# Patient Record
Sex: Female | Born: 1940 | Race: White | Hispanic: No | Smoking: Never smoker
Health system: Southern US, Community
[De-identification: ages and names within clinical notes are randomized; demographics above are authoritative.]

## PROBLEM LIST (undated history)

## (undated) DIAGNOSIS — F329 Major depressive disorder, single episode, unspecified: Secondary | ICD-10-CM

## (undated) DIAGNOSIS — F411 Generalized anxiety disorder: Secondary | ICD-10-CM

## (undated) DIAGNOSIS — M1990 Unspecified osteoarthritis, unspecified site: Secondary | ICD-10-CM

## (undated) DIAGNOSIS — J020 Streptococcal pharyngitis: Secondary | ICD-10-CM

## (undated) DIAGNOSIS — R0982 Postnasal drip: Secondary | ICD-10-CM

## (undated) DIAGNOSIS — F419 Anxiety disorder, unspecified: Secondary | ICD-10-CM

## (undated) DIAGNOSIS — F325 Major depressive disorder, single episode, in full remission: Secondary | ICD-10-CM

## (undated) DIAGNOSIS — M722 Plantar fascial fibromatosis: Secondary | ICD-10-CM

## (undated) DIAGNOSIS — R41840 Attention and concentration deficit: Secondary | ICD-10-CM

## (undated) HISTORY — DX: Attention and concentration deficit: R41.840

## (undated) HISTORY — DX: Plantar fascial fibromatosis: M72.2

## (undated) HISTORY — DX: Major depressive disorder, single episode, unspecified: F32.9

## (undated) HISTORY — PX: OTHER SURGICAL HISTORY: SHX169

## (undated) HISTORY — PX: BREAST CYST ASPIRATION: SHX578

## (undated) HISTORY — DX: Generalized anxiety disorder: F41.1

## (undated) HISTORY — DX: Major depressive disorder, single episode, in full remission: F32.5

## (undated) HISTORY — DX: Unspecified osteoarthritis, unspecified site: M19.90

---

## 1986-05-24 HISTORY — PX: ABDOMINAL HYSTERECTOMY: SHX81

## 1998-11-20 ENCOUNTER — Ambulatory Visit (HOSPITAL_COMMUNITY): Admission: RE | Admit: 1998-11-20 | Discharge: 1998-11-20 | Payer: Self-pay | Admitting: Obstetrics & Gynecology

## 1999-05-26 ENCOUNTER — Encounter: Payer: Self-pay | Admitting: *Deleted

## 1999-05-26 ENCOUNTER — Encounter: Admission: RE | Admit: 1999-05-26 | Discharge: 1999-05-26 | Payer: Self-pay | Admitting: *Deleted

## 2000-04-08 ENCOUNTER — Encounter: Admission: RE | Admit: 2000-04-08 | Discharge: 2000-04-08 | Payer: Self-pay | Admitting: Occupational Medicine

## 2000-04-08 ENCOUNTER — Encounter: Payer: Self-pay | Admitting: Occupational Medicine

## 2000-07-14 ENCOUNTER — Encounter: Admission: RE | Admit: 2000-07-14 | Discharge: 2000-07-14 | Payer: Self-pay | Admitting: *Deleted

## 2000-07-14 ENCOUNTER — Encounter: Payer: Self-pay | Admitting: *Deleted

## 2000-07-21 ENCOUNTER — Encounter: Admission: RE | Admit: 2000-07-21 | Discharge: 2000-07-21 | Payer: Self-pay | Admitting: *Deleted

## 2000-07-21 ENCOUNTER — Encounter: Payer: Self-pay | Admitting: *Deleted

## 2001-08-17 ENCOUNTER — Encounter: Payer: Self-pay | Admitting: *Deleted

## 2001-08-17 ENCOUNTER — Encounter: Admission: RE | Admit: 2001-08-17 | Discharge: 2001-08-17 | Payer: Self-pay | Admitting: *Deleted

## 2002-11-05 ENCOUNTER — Encounter: Payer: Self-pay | Admitting: *Deleted

## 2002-11-05 ENCOUNTER — Encounter: Admission: RE | Admit: 2002-11-05 | Discharge: 2002-11-05 | Payer: Self-pay | Admitting: *Deleted

## 2003-02-11 ENCOUNTER — Encounter (INDEPENDENT_AMBULATORY_CARE_PROVIDER_SITE_OTHER): Payer: Self-pay | Admitting: Specialist

## 2003-02-11 ENCOUNTER — Ambulatory Visit (HOSPITAL_COMMUNITY): Admission: RE | Admit: 2003-02-11 | Discharge: 2003-02-11 | Payer: Self-pay | Admitting: Gastroenterology

## 2003-11-08 ENCOUNTER — Encounter: Admission: RE | Admit: 2003-11-08 | Discharge: 2003-11-08 | Payer: Self-pay | Admitting: Family Medicine

## 2003-11-21 ENCOUNTER — Encounter: Admission: RE | Admit: 2003-11-21 | Discharge: 2003-11-21 | Payer: Self-pay | Admitting: Family Medicine

## 2004-11-12 ENCOUNTER — Encounter: Admission: RE | Admit: 2004-11-12 | Discharge: 2004-11-12 | Payer: Self-pay | Admitting: Family Medicine

## 2005-12-17 ENCOUNTER — Encounter: Admission: RE | Admit: 2005-12-17 | Discharge: 2005-12-17 | Payer: Self-pay | Admitting: Family Medicine

## 2006-12-19 ENCOUNTER — Encounter: Admission: RE | Admit: 2006-12-19 | Discharge: 2006-12-19 | Payer: Self-pay | Admitting: Family Medicine

## 2007-07-08 ENCOUNTER — Encounter: Admission: RE | Admit: 2007-07-08 | Discharge: 2007-07-08 | Payer: Self-pay | Admitting: Family Medicine

## 2007-12-29 ENCOUNTER — Encounter: Admission: RE | Admit: 2007-12-29 | Discharge: 2007-12-29 | Payer: Self-pay | Admitting: Family Medicine

## 2009-04-03 ENCOUNTER — Encounter: Admission: RE | Admit: 2009-04-03 | Discharge: 2009-04-03 | Payer: Self-pay | Admitting: Family Medicine

## 2010-04-28 ENCOUNTER — Encounter: Admission: RE | Admit: 2010-04-28 | Discharge: 2010-04-28 | Payer: Self-pay | Admitting: Family Medicine

## 2010-06-15 ENCOUNTER — Encounter: Payer: Self-pay | Admitting: Family Medicine

## 2010-10-09 NOTE — Op Note (Signed)
   [name], [name]                         ACCOUNT NO.:  [id number]   MEDICAL RECORD NO.:  [id number]                   PATIENT TYPE:  AMB   LOCATION:  ENDO                                 FACILITY:  [facility]   PHYSICIAN:  Petra Kuba, M.D.                 DATE OF BIRTH:  Jun 02, 1940   DATE OF PROCEDURE:  02/11/2003  DATE OF DISCHARGE:                                 OPERATIVE REPORT   PROCEDURE:  Colonoscopy with polypectomy.   INDICATIONS:  Occasional melena, due for colonic screening.  Consent was  signed after risks, benefits, methods, and options thoroughly discussed in  the office.   MEDICINES USED:  Demerol 60 mg, Versed 6 mg.   DESCRIPTION OF PROCEDURE:  Rectal inspection was pertinent for external  hemorrhoids, small.  Digital exam was negative.  The video pediatric  adjustable colonoscope was inserted and despite a long, looping colon, with  abdominal pressure was able to be advanced to the cecum.  No obvious  abnormality was seen on insertion or signs of bleeding.  The scope was  inserted a short way into the terminal ileum, which was normal.  The scope  was slowly withdrawn.  Prep was adequate.  There was some liquid stool that  required washing and suctioning.  On slow withdrawal through the colon, in  the mid-transverse a questionable tiny polyp was seen and was hot biopsied  x1.  The scope was withdrawn around the left side of the colon.  In the  distal sigmoid and rectum, three other tiny polyps were seen and were all  hot biopsied and put in a second container.  Anorectal pull-through in  retroflexion confirmed some hemorrhoids.  The scope was straightened and  readvanced a short way up the left side of the colon, air was suctioned, and  the scope removed.  The patient tolerated the procedure well.  There was no  obvious immediate complication.   ENDOSCOPIC DIAGNOSES:  1. Internal-external hemorrhoids.  2. Four tiny polyps hot biopsied, one in the  transverse, three in the rectum     and distal sigmoid.  3. Long, looping colon.  4. Otherwise within normal limits to the end of the terminal ileum without     any blood being seen.   PLAN:  1. Await pathology to determine future colonic screening.  2. Continue workup with a one-time EGD.                                               Petra Kuba, M.D.   MEM/MEDQ  D:  02/11/2003  T:  02/11/2003  Job:  [id number]   cc:   Al Decant. Janey Greaser, MD  [address]  [place]  [place] [address]  Fax: [phone]

## 2010-10-09 NOTE — Op Note (Signed)
   NAMEKEEVA, Anne [name]                         ACCOUNT NO.:  [id number]   MEDICAL RECORD NO.:  [id number]                   PATIENT TYPE:  AMB   LOCATION:  ENDO                                 FACILITY:  [facility]   PHYSICIAN:  Petra Kuba, M.D.                 DATE OF BIRTH:  May 01, 1941   DATE OF PROCEDURE:  02/11/2003  DATE OF DISCHARGE:                                 OPERATIVE REPORT   PROCEDURE:  Esophagogastroduodenoscopy with biopsy.   INDICATIONS:  Patient with occasional melena, nondiagnostic colonoscopy.  Consent was signed after risks, benefits, methods, and options thoroughly  discussed before any premedications given.   Additional medicines for this procedure:  Demerol 20 mg, Versed 2 mg.   DESCRIPTION OF PROCEDURE:  The video endoscope was inserted by direct  vision.  The esophagus was normal.  In the distal esophagus was a small  hiatal hernia.  The scope passed into the stomach, advanced through the  antrum, pertinent for some minimal antritis, through a normal pylorus, and  into a normal duodenal bulb, and around the C-loop to a normal second  portion of the duodenum.  The scope was withdrawn back to the bulb and a  good look there ruled out ulcer in that location.  The stomach was evaluated  on retroflex and straight visualization.  There was a moderate amount of  proximal, slightly hemorrhagic gastritis greater than distal gastritis.  High in the cardia the hiatal hernia was confirmed.  Otherwise, the  angularis, fundus, lesser and greater curve were normal on retroflex and  straight visualization.  Air was suctioned.  The scope was slowly withdrawn  after one biopsy of the antrum for the CLOtest to rule out Helicobacter was  obtained.  Again a good look at the esophagus was normal.  The scope was  removed.  The patient tolerated the procedure well.  There was no obvious  abnormality or complication.   ENDOSCOPIC DIAGNOSES:  1. Small hiatal hernia.  2.  Moderate proximal greater than distal gastritis, status post CLO biopsy     to rule out Helicobacter.  3. Otherwise normal EGD.   PLAN:  1. Once a day over-the-counter Prilosec.  2. Follow up in two months to recheck symptoms, possibly CBC, and see if a     one-time small bowel follow-through is needed.                                               Petra Kuba, M.D.    MEM/MEDQ  D:  02/11/2003  T:  02/11/2003  Job:  [id number]   cc:   Al Decant. Janey Greaser, MD  [address]  [place]  [place] [address]  Fax: [phone]

## 2011-04-20 ENCOUNTER — Other Ambulatory Visit: Payer: Self-pay | Admitting: Family Medicine

## 2011-04-20 DIAGNOSIS — Z1231 Encounter for screening mammogram for malignant neoplasm of breast: Secondary | ICD-10-CM

## 2011-04-30 ENCOUNTER — Ambulatory Visit
Admission: RE | Admit: 2011-04-30 | Discharge: 2011-04-30 | Disposition: A | Discharge status: Home / Self Care | Payer: BC Managed Care – PPO | Source: Ambulatory Visit | Attending: Family Medicine | Admitting: Family Medicine

## 2011-04-30 DIAGNOSIS — Z1231 Encounter for screening mammogram for malignant neoplasm of breast: Secondary | ICD-10-CM

## 2011-05-25 HISTORY — PX: EYE SURGERY: SHX253

## 2012-05-08 ENCOUNTER — Other Ambulatory Visit: Payer: Self-pay | Admitting: Family Medicine

## 2012-05-08 DIAGNOSIS — Z1231 Encounter for screening mammogram for malignant neoplasm of breast: Secondary | ICD-10-CM

## 2012-05-12 ENCOUNTER — Ambulatory Visit: Payer: BC Managed Care – PPO

## 2012-05-12 ENCOUNTER — Ambulatory Visit
Admission: RE | Admit: 2012-05-12 | Discharge: 2012-05-12 | Disposition: A | Discharge status: Home / Self Care | Payer: Medicare Other | Source: Ambulatory Visit | Attending: Family Medicine | Admitting: Family Medicine

## 2012-05-12 DIAGNOSIS — Z1231 Encounter for screening mammogram for malignant neoplasm of breast: Secondary | ICD-10-CM

## 2012-05-18 ENCOUNTER — Ambulatory Visit: Payer: Medicare Other

## 2012-05-31 ENCOUNTER — Ambulatory Visit: Payer: BC Managed Care – PPO

## 2013-06-24 NOTE — H&P (Signed)
Subjective:  Chief Complaint: left hip OA / pain  HPI: Anne [name], 73 y.o. female, has a history of pain and functional disability in the left hip(s) due to arthritis and patient has failed non-surgical conservative treatments for greater than 12 weeks to include NSAID's and/or analgesics, corticosteriod injections and activity modification. Onset of symptoms was gradual starting 3+ years ago with gradually worsening course since that time.The patient noted no past surgery on the left hip(s). Patient currently rates pain in the left hip at 8 out of 10 with activity. Patient has worsening of pain with activity and weight bearing, trendelenberg gait, pain that interfers with activities of daily living and pain with passive range of motion. Patient has evidence of periarticular osteophytes and joint space narrowing by imaging studies. This condition presents safety issues increasing the risk of falls. There is no current active infection. Risks, benefits and expectations were discussed with the patient. Risks including but not limited to the risk of anesthesia, blood clots, nerve damage, blood vessel damage, failure of the prosthesis, infection and up to and including death. Patient understand the risks, benefits and expectations and wishes to proceed with surgery.  D/C Plans: Home with HHPT/SNF  Post-op Meds: No Rx given  Tranexamic Acid: To be given  Decadron: To be given  FYI: ASA post-op Norco post-op  The patient is a [age] year old female who comes in today for a preoperative History and Physical. The patient is scheduled for a left total knee arthroplasty to be performed by Dr. Madlyn FrankelMatthew D. Charlann Boxerlin, MD at [facility] on 07/03/13 . Please see the hospital record for complete dictated history and physical.     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  06/05/2008    Allergies(Daniel R Southard; 06/15/2013 11:46 AM) SULFA. 06/06/2008    Family History(Devun Anna S Jodeci Rini, PA-C; 06/24/2013 3:13 PM) Heart disease in female family member before age 955 Heart Disease. First Degree Relatives. father Cancer. mother mother and brother Congestive Heart Failure. father    Social History(Daniel R Southard; 06/15/2013 11:46 AM) Number of flights of stairs before winded. 4-5 Tobacco / smoke exposure. no Alcohol use. current drinker; drinks wine; 5-7 per week current drinker; drinks wine and hard liquor; 8-14 per week Children. 3 Current work status. retired Financial plannerDrug/Alcohol Rehab (Currently). no Copy of Drug/Alcohol Rehab (Previously). no Exercise. Exercises daily; does running / walking Exercises daily; does running / walking and other Illicit drug use. no Living situation. live alone Marital status. widowed Pain Contract. no Tobacco use. Never smoker. never smoker    Medication History(Daniel R Southard; 06/15/2013 11:46 AM) Tylenol Arthritis Pain (650MG  Tablet ER, Oral) Active. [place] ( Oral) Active. Oxybutynin Chloride ER (10MG  Tablet ER 24HR, Oral) Active. Hyoscyamine Sulfate (0.125MG  Tab Sublingual, Sublingual) Active. Zoloft Active.    Past Surgical History(Shana Zavaleta S Jairen Goldfarb, PA-C; 06/24/2013 3:13 PM) Hysterectomy. complete (cancerous) Tonsillectomy Foot Surgery. right Cataract Surgery. bilateral Colon Polyp Removal - Colonoscopy    Other Problems(Amilia Vandenbrink S Keenan Trefry, PA-C; 06/24/2013 3:13 PM) Oophorectomy. bilateral No pertinent past medical history Depression    Review of Systems(Kenyada Dosch S Heavenleigh Petruzzi, PA-C; 06/24/2013 3:13 PM) General:Not Present- Chills, Fever, Night Sweats, Appetite Loss, Fatigue, Feeling sick, Weight Gain and Weight Loss. Skin:Not Present- Itching, Rash, Skin Color Changes, Ulcer, Psoriasis and Change in Hair or Nails. HEENT:Present- Sensitivity to light and Hearing  problems. Not Present- Nose Bleed and Ringing in the Ears. Neck:Not Present- Swollen Glands and Neck Mass. Respiratory:Not  Present- Snoring, Chronic Cough, Bloody sputum and Dyspnea. Cardiovascular:Not Present- Shortness of Breath, Chest Pain, Swelling of Extremities, Leg Cramps and Palpitations. Gastrointestinal:Not Present- Bloody Stool, Heartburn, Abdominal Pain, Vomiting, Nausea and Incontinence of Stool. Female Genitourinary:Present- Urinary frequency. Not Present- Blood in Urine, Menstrual Irregularities, Frequency, Incontinence and Nocturia. Musculoskeletal:Present- Muscle Pain and Joint Pain. Not Present- Muscle Weakness, Joint Stiffness, Joint Swelling and Back Pain. Neurological:Not Present- Tingling, Numbness, Burning, Tremor, Headaches and Dizziness. Psychiatric:Not Present- Anxiety, Depression and Memory Loss. Endocrine:Not Present- Cold Intolerance, Heat Intolerance, Excessive hunger and Excessive Thirst. Hematology:Not Present- Abnormal Bleeding, Anemia, Blood Clots and Easy Bruising.    Vitals(Daniel R Southard; 06/15/2013 11:45 AM) 06/15/2013 11:45 AM Weight: 167 lb Height: 66.5 in Weight was reported by patient. Height was reported by patient. Body Surface Area: 1.89 m Body Mass Index: 26.55 kg/m BP: 132/80 (Sitting, Left Arm, Standard)     Physical Exam(Hiran Leard S Tinslee Klare, PA-C; 06/24/2013 3:17 PM) The physical exam findings are as follows:   General Mental Status - Alert, cooperative and good historian. General Appearance- pleasant. Not in acute distress. Orientation- Oriented X3. Build & Nutrition- Well nourished and Well developed.   Head and Neck Head- normocephalic, atraumatic . Neck Global Assessment- supple. no bruit auscultated on the right and no bruit auscultated on the left.   Eye Pupil- Bilateral- PERRLA. Motion- Bilateral- EOMI.   Chest and Lung Exam Auscultation: Breath sounds:- clear at anterior chest  wall and - clear at posterior chest wall. Adventitious sounds:- No Adventitious sounds.   Cardiovascular Auscultation:Rhythm- Regular rate and rhythm. Heart Sounds- S1 WNL and S2 WNL. Murmurs & Other Heart Sounds:Auscultation of the heart reveals - No Murmurs.   Abdomen Palpation/Percussion:Tenderness- Abdomen is non-tender to palpation. Rigidity (guarding)- Abdomen is soft.   Female Genitourinary   Note: Not done, not pertinent to present illness  Musculoskeletal Lower Extremity Left Lower Extremity: Left Hip : Inspection and Palpation:Clinical Contours- normal clinical contours. Tenderness- generalized tenderness about the hip, medial thigh tender to palpation, lateral thigh tender to palpation and tender to palpation about the groin. Swelling- none. Sensation- intact to light touch. Other characteristics- normal warmth. Skin:Color- no ecchymosis and no erythema. Assessment of pain reveals the following findings:: The pain is characterized as - severe, constant and deep. ROM: Flexion:AROM- Moderately decreased. Abduction:AROM- Moderately decreased. Internal Rotation:AROM- Moderately decreased. External Rotation:AROM- Moderately decreased.

## 2013-06-25 ENCOUNTER — Encounter (HOSPITAL_COMMUNITY): Payer: Self-pay | Admitting: Pharmacy Technician

## 2013-06-26 ENCOUNTER — Other Ambulatory Visit (HOSPITAL_COMMUNITY): Payer: Self-pay | Admitting: Orthopedic Surgery

## 2013-06-26 DIAGNOSIS — J020 Streptococcal pharyngitis: Secondary | ICD-10-CM

## 2013-06-26 HISTORY — DX: Streptococcal pharyngitis: J02.0

## 2013-06-26 NOTE — Patient Instructions (Signed)
20 Anne MorinGeorgina C [name]  06/26/2013   Your procedure is scheduled on: Tuesday February 10th  Report to Anne [name] at 515 AM.  Call this number if you have problems the morning of surgery [phone]   Remember: NO VISITORS UNDER AGE 71 PER [place] POLICY.   Do not eat food or drink liquids :After Midnight.     Take these medicines the morning of surgery with A SIP OF WATER:                                 SEE [place] PREPARING FOR SURGERY SHEET             You may not have any metal on your body including hair pins and piercings  Do not wear jewelry, make-up.  Do not wear lotions, powders, or perfumes. No  Deodorant is to be worn.   Men may shave face and neck.  Do not bring valuables to the hospital. [facility] IS NOT RESPONSIBLE FOR VALUEABLES.  Contacts, dentures or bridgework may not be worn into surgery.  Leave suitcase in the car. After surgery it may be brought to your room.  For patients admitted to the hospital, checkout time is 11:00 AM the day of discharge.    Please read over the following fact sheets that you were given: [facility] Preparing for surgery sheet, MRSA information, incentive spirometer sheet, blood fact sheet  Call Anne SieveSharon Mckenzy Salazar RN pre op nurse if needed [phone]    FAILURE TO FOLLOW THESE INSTRUCTIONS MAY RESULT IN THE CANCELLATION OF YOUR SURGERY.  PATIENT SIGNATURE___________________________________________  NURSE SIGNATURE_____________________________________________

## 2013-06-27 ENCOUNTER — Encounter (INDEPENDENT_AMBULATORY_CARE_PROVIDER_SITE_OTHER): Payer: Self-pay

## 2013-06-27 NOTE — Progress Notes (Signed)
Patient with strep  Throat on antibiotics less than 24 hours called dr Charlann Boxerolin, cancel pre op 06-27-13, do pre op 06-29-13 and pt ok for surgery 07-03-13, does not need medical clearance per dr Charlann Boxerolin, pre op rescheduled for 06-29-13 800 am

## 2013-06-29 ENCOUNTER — Encounter (HOSPITAL_COMMUNITY)
Admission: RE | Admit: 2013-06-29 | Discharge: 2013-06-29 | Disposition: A | Discharge status: Home / Self Care | Payer: Medicare Other | Source: Ambulatory Visit | Attending: Orthopedic Surgery | Admitting: Orthopedic Surgery

## 2013-06-29 ENCOUNTER — Encounter (HOSPITAL_COMMUNITY): Payer: Self-pay

## 2013-06-29 DIAGNOSIS — Z01812 Encounter for preprocedural laboratory examination: Secondary | ICD-10-CM | POA: Insufficient documentation

## 2013-06-29 HISTORY — DX: Anxiety disorder, unspecified: F41.9

## 2013-06-29 HISTORY — DX: Streptococcal pharyngitis: J02.0

## 2013-06-29 HISTORY — DX: Major depressive disorder, single episode, unspecified: F32.9

## 2013-06-29 HISTORY — DX: Unspecified osteoarthritis, unspecified site: M19.90

## 2013-06-29 LAB — BASIC METABOLIC PANEL
BUN: 12 mg/dL (ref 6–23)
CO2: 30 meq/L (ref 19–32)
Calcium: 9.1 mg/dL (ref 8.4–10.5)
Chloride: 100 mEq/L (ref 96–112)
Creatinine, Ser: 0.76 mg/dL (ref 0.50–1.10)
GFR calc Af Amer: >90 mL/min (ref >90)
GFR calc non Af Amer: 83 mL/min — ABNORMAL LOW (ref >90)
GLUCOSE: 71 mg/dL (ref 70–99)
POTASSIUM: 4.5 meq/L (ref 3.7–5.3)
SODIUM: 140 meq/L (ref 137–147)

## 2013-06-29 LAB — CBC
HCT: 40.9 % (ref 36.0–46.0)
Hemoglobin: 13.3 g/dL (ref 12.0–15.0)
MCH: 30.2 pg (ref 26.0–34.0)
MCHC: 32.5 g/dL (ref 30.0–36.0)
MCV: 93 fL (ref 78.0–100.0)
Platelets: 170 10*3/uL (ref 150–400)
RBC: 4.4 MIL/uL (ref 3.87–5.11)
RDW: 12.7 % (ref 11.5–15.5)
WBC: 5 10*3/uL (ref 4.0–10.5)

## 2013-06-29 LAB — PROTIME-INR
INR: 0.9 (ref 0.00–1.49)
PROTHROMBIN TIME: 12 s (ref 11.6–15.2)

## 2013-06-29 LAB — ABO/RH: ABO/RH(D): A POS

## 2013-06-29 LAB — URINALYSIS, ROUTINE W REFLEX MICROSCOPIC
BILIRUBIN URINE: NEGATIVE
Glucose, UA: NEGATIVE mg/dL
Hgb urine dipstick: NEGATIVE
Ketones, ur: NEGATIVE mg/dL
NITRITE: NEGATIVE
Protein, ur: NEGATIVE mg/dL
SPECIFIC GRAVITY, URINE: 1.022 (ref 1.005–1.030)
UROBILINOGEN UA: 0.2 mg/dL (ref 0.0–1.0)
pH: 6 (ref 5.0–8.0)

## 2013-06-29 LAB — URINE MICROSCOPIC-ADD ON

## 2013-06-29 LAB — SURGICAL PCR SCREEN
MRSA, PCR: NEGATIVE
STAPHYLOCOCCUS AUREUS: POSITIVE — AB

## 2013-06-29 LAB — APTT: aPTT: 26 seconds (ref 24–37)

## 2013-06-29 NOTE — Progress Notes (Signed)
Micro ua results sent by epic to dr Charlann Boxerolin

## 2013-06-29 NOTE — Progress Notes (Signed)
Spoke with patient by phone, patient stated feeling much better and stronger now.

## 2013-06-29 NOTE — Patient Instructions (Addendum)
20 Alvester MorinGeorgina C Cipollone  06/29/2013   Your procedure is scheduled on:  Tuesday February 10th  Report to [facility] at 515 AM.  Call this number if you have problems the morning of surgery [phone]   Remember: call Rogerio Boutelle with you antibiotic dosages phone number [phone]  Do not eat food or drink liquids :After Midnight.     Take these medicines the morning of surgery with A SIP OF WATER: none                                SEE [place] PREPARING FOR SURGERY SHEET             You may not have any metal on your body including hair pins and piercings  Do not wear jewelry, make-up.  Do not wear lotions, powders, or perfumes. No  Deodorant is to be worn.   Men may shave face and neck.  Do not bring valuables to the hospital.  IS NOT RESPONSIBLE FOR VALUEABLES.  Contacts, dentures or bridgework may not be worn into surgery.  Leave suitcase in the car. After surgery it may be brought to your room.  For patients admitted to the hospital, checkout time is 11:00 AM the day of discharge.    Please read over the following fact sheets that you were given: [facility] Preparing for surgery sheet, MRSA information, incentive spirometer sheet, blood fact sheet  Call Cain SieveSharon Lorenna Lurry RN pre op nurse if needed [phone]    FAILURE TO FOLLOW THESE INSTRUCTIONS MAY RESULT IN THE CANCELLATION OF YOUR SURGERY.  PATIENT SIGNATURE___________________________________________  NURSE SIGNATURE_____________________________________________

## 2013-06-29 NOTE — Progress Notes (Signed)
CALLED SHERRY WILLS AND MADE AWARE PATIENT NOT FEELING WELL AT PRE OP, SHERRY WILL PASS THIS INFORMATION TO DR Charlann BoxerLIN.

## 2013-07-02 ENCOUNTER — Encounter (HOSPITAL_COMMUNITY): Payer: Self-pay | Admitting: Anesthesiology

## 2013-07-02 NOTE — Anesthesia Preprocedure Evaluation (Addendum)
Anesthesia Evaluation  Patient identified by MRN, date of birth, ID band Patient awake    Reviewed: Allergy & Precautions, H&P , NPO status , Patient's Chart, lab work & pertinent test results  Airway Mallampati: II TM Distance: >3 FB Neck ROM: Full    Dental no notable dental hx.    Pulmonary neg pulmonary ROS,  breath sounds clear to auscultation  Pulmonary exam normal       Cardiovascular negative cardio ROS  Rhythm:Regular Rate:Normal     Neuro/Psych PSYCHIATRIC DISORDERS Anxiety Depression negative neurological ROS     GI/Hepatic negative GI ROS, Neg liver ROS,   Endo/Other  negative endocrine ROS  Renal/GU negative Renal ROS  negative genitourinary   Musculoskeletal negative musculoskeletal ROS (+)   Abdominal   Peds negative pediatric ROS (+)  Hematology negative hematology ROS (+)   Anesthesia Other Findings   Reproductive/Obstetrics negative OB ROS                           Anesthesia Physical Anesthesia Plan  ASA: II  Anesthesia Plan: Spinal   Post-op Pain Management:    Induction: Intravenous  Airway Management Planned:   Additional Equipment:   Intra-op Plan:   Post-operative Plan:   Informed Consent: I have reviewed the patients History and Physical, chart, labs and discussed the procedure including the risks, benefits and alternatives for the proposed anesthesia with the patient or authorized representative who has indicated his/her understanding and acceptance.   Dental advisory given  Plan Discussed with: CRNA  Anesthesia Plan Comments: (Discussed r/b general versus spinal. Discussed risks/benefits of spinal including headache, backache, failure, bleeding, infection, and nerve damage. Patient consents to spinal. Questions answered. Coagulation studies and platelet count acceptable.  She has been on amoxicillin for 7 days for strep throat and she has  recovered. Dr. Charlann Boxerlin aware.)       Anesthesia Quick Evaluation

## 2013-07-03 ENCOUNTER — Inpatient Hospital Stay (HOSPITAL_COMMUNITY): Payer: Medicare Other | Admitting: Anesthesiology

## 2013-07-03 ENCOUNTER — Encounter (HOSPITAL_COMMUNITY)
Admission: RE | Disposition: A | Discharge status: Home Health | Payer: Self-pay | Source: Ambulatory Visit | Attending: Orthopedic Surgery

## 2013-07-03 ENCOUNTER — Encounter (HOSPITAL_COMMUNITY): Payer: Self-pay

## 2013-07-03 ENCOUNTER — Inpatient Hospital Stay (HOSPITAL_COMMUNITY)
Admission: RE | Admit: 2013-07-03 | Discharge: 2013-07-05 | DRG: 470 | Disposition: A | Discharge status: Home Health | Payer: Medicare Other | Source: Ambulatory Visit | Attending: Orthopedic Surgery | Admitting: Orthopedic Surgery

## 2013-07-03 ENCOUNTER — Inpatient Hospital Stay (HOSPITAL_COMMUNITY): Payer: Medicare Other

## 2013-07-03 ENCOUNTER — Encounter (HOSPITAL_COMMUNITY): Payer: Medicare Other | Admitting: Anesthesiology

## 2013-07-03 DIAGNOSIS — Z96649 Presence of unspecified artificial hip joint: Secondary | ICD-10-CM

## 2013-07-03 DIAGNOSIS — D62 Acute posthemorrhagic anemia: Secondary | ICD-10-CM | POA: Diagnosis not present

## 2013-07-03 DIAGNOSIS — F3289 Other specified depressive episodes: Secondary | ICD-10-CM | POA: Diagnosis present

## 2013-07-03 DIAGNOSIS — F411 Generalized anxiety disorder: Secondary | ICD-10-CM | POA: Diagnosis present

## 2013-07-03 DIAGNOSIS — Z79899 Other long term (current) drug therapy: Secondary | ICD-10-CM

## 2013-07-03 DIAGNOSIS — D500 Iron deficiency anemia secondary to blood loss (chronic): Secondary | ICD-10-CM | POA: Diagnosis not present

## 2013-07-03 DIAGNOSIS — Z6827 Body mass index (BMI) 27.0-27.9, adult: Secondary | ICD-10-CM

## 2013-07-03 DIAGNOSIS — Z01812 Encounter for preprocedural laboratory examination: Secondary | ICD-10-CM

## 2013-07-03 DIAGNOSIS — M1610 Unilateral primary osteoarthritis, unspecified hip: Principal | ICD-10-CM | POA: Diagnosis present

## 2013-07-03 DIAGNOSIS — E663 Overweight: Secondary | ICD-10-CM | POA: Diagnosis present

## 2013-07-03 DIAGNOSIS — F329 Major depressive disorder, single episode, unspecified: Secondary | ICD-10-CM | POA: Diagnosis present

## 2013-07-03 DIAGNOSIS — M169 Osteoarthritis of hip, unspecified: Principal | ICD-10-CM | POA: Diagnosis present

## 2013-07-03 HISTORY — DX: Presence of unspecified artificial hip joint: Z96.649

## 2013-07-03 HISTORY — PX: TOTAL HIP ARTHROPLASTY: SHX124

## 2013-07-03 HISTORY — DX: Postnasal drip: R09.82

## 2013-07-03 LAB — TYPE AND SCREEN
ABO/RH(D): A POS
ANTIBODY SCREEN: NEGATIVE

## 2013-07-03 SURGERY — ARTHROPLASTY, HIP, TOTAL, ANTERIOR APPROACH
Anesthesia: Spinal | Site: Hip | Laterality: Left

## 2013-07-03 MED ORDER — KETAMINE HCL 10 MG/ML IJ SOLN
INTRAMUSCULAR | Status: AC
  Filled 2013-07-03: qty 1

## 2013-07-03 MED ORDER — ALUM & MAG HYDROXIDE-SIMETH 200-200-20 MG/5ML PO SUSP: 30.0000 mL | ORAL | Status: DC | PRN

## 2013-07-03 MED ORDER — KETAMINE HCL 10 MG/ML IJ SOLN
INTRAMUSCULAR | Status: DC | PRN
  Administered 2013-07-03 (×4): 5 mg via INTRAVENOUS

## 2013-07-03 MED ORDER — LACTATED RINGERS IV SOLN
INTRAVENOUS | Status: DC
Start: 2013-07-03 — End: 2013-07-03
  Administered 2013-07-03: 1000 mL via INTRAVENOUS

## 2013-07-03 MED ORDER — MIDAZOLAM HCL 2 MG/2ML IJ SOLN
INTRAMUSCULAR | Status: AC
  Filled 2013-07-03: qty 2

## 2013-07-03 MED ORDER — BUPIVACAINE IN DEXTROSE 0.75-8.25 % IT SOLN
INTRATHECAL | Status: DC | PRN
  Administered 2013-07-03: 2 mL via INTRATHECAL

## 2013-07-03 MED ORDER — SERTRALINE HCL 50 MG PO TABS
150.0000 mg | ORAL_TABLET | Freq: Every morning | ORAL | Status: DC
  Administered 2013-07-03 – 2013-07-05 (×3): 150 mg via ORAL
  Filled 2013-07-03 (×3): qty 1

## 2013-07-03 MED ORDER — CEFAZOLIN SODIUM-DEXTROSE 2-3 GM-% IV SOLR
INTRAVENOUS | Status: AC
  Filled 2013-07-03: qty 50

## 2013-07-03 MED ORDER — MIDAZOLAM HCL 5 MG/5ML IJ SOLN
INTRAMUSCULAR | Status: DC | PRN
  Administered 2013-07-03: 2 mg via INTRAVENOUS

## 2013-07-03 MED ORDER — HYDROMORPHONE HCL PF 1 MG/ML IJ SOLN
0.2000 mg | INTRAMUSCULAR | Status: DC | PRN
  Administered 2013-07-03 – 2013-07-04 (×2): 0.5 mg via INTRAVENOUS
  Filled 2013-07-03 (×2): qty 1

## 2013-07-03 MED ORDER — PROPOFOL 10 MG/ML IV BOLUS
INTRAVENOUS | Status: AC
Start: 2013-07-03 — End: 2013-07-03
  Filled 2013-07-03: qty 20

## 2013-07-03 MED ORDER — ASPIRIN EC 325 MG PO TBEC
325.0000 mg | DELAYED_RELEASE_TABLET | Freq: Two times a day (BID) | ORAL | Status: DC
  Administered 2013-07-04 – 2013-07-05 (×3): 325 mg via ORAL
  Filled 2013-07-03 (×5): qty 1

## 2013-07-03 MED ORDER — DEXAMETHASONE SODIUM PHOSPHATE 10 MG/ML IJ SOLN
10.0000 mg | Freq: Once | INTRAMUSCULAR | Status: DC
  Filled 2013-07-03: qty 1

## 2013-07-03 MED ORDER — PROPOFOL INFUSION 10 MG/ML OPTIME
INTRAVENOUS | Status: DC | PRN
  Administered 2013-07-03: 75 ug/kg/min via INTRAVENOUS

## 2013-07-03 MED ORDER — CEFAZOLIN SODIUM-DEXTROSE 2-3 GM-% IV SOLR
2.0000 g | INTRAVENOUS | Status: AC
  Administered 2013-07-03: 2 g via INTRAVENOUS

## 2013-07-03 MED ORDER — ZOLPIDEM TARTRATE 5 MG PO TABS: 5.0000 mg | ORAL_TABLET | Freq: Every evening | ORAL | Status: DC | PRN

## 2013-07-03 MED ORDER — DEXAMETHASONE SODIUM PHOSPHATE 10 MG/ML IJ SOLN
10.0000 mg | Freq: Once | INTRAMUSCULAR | Status: AC
Start: 2013-07-03 — End: 2013-07-03
  Administered 2013-07-03: 10 mg via INTRAVENOUS

## 2013-07-03 MED ORDER — PROPOFOL 10 MG/ML IV BOLUS
INTRAVENOUS | Status: AC
  Filled 2013-07-03: qty 20

## 2013-07-03 MED ORDER — FENTANYL CITRATE 0.05 MG/ML IJ SOLN
INTRAMUSCULAR | Status: AC
  Filled 2013-07-03: qty 5

## 2013-07-03 MED ORDER — PHENYLEPHRINE HCL 10 MG/ML IJ SOLN
INTRAMUSCULAR | Status: DC | PRN
  Administered 2013-07-03: 80 ug via INTRAVENOUS
  Administered 2013-07-03 (×3): 40 ug via INTRAVENOUS

## 2013-07-03 MED ORDER — CHLORHEXIDINE GLUCONATE 4 % EX LIQD: 60.0000 mL | Freq: Once | CUTANEOUS | Status: DC

## 2013-07-03 MED ORDER — ONDANSETRON HCL 4 MG/2ML IJ SOLN
INTRAMUSCULAR | Status: DC | PRN
  Administered 2013-07-03: 4 mg via INTRAVENOUS

## 2013-07-03 MED ORDER — HYDROCODONE-ACETAMINOPHEN 7.5-325 MG PO TABS
1.0000 | ORAL_TABLET | ORAL | Status: DC
  Administered 2013-07-03: 2 via ORAL
  Administered 2013-07-03: 1 via ORAL
  Administered 2013-07-03: 2 via ORAL
  Administered 2013-07-03: 1 via ORAL
  Administered 2013-07-04 (×3): 2 via ORAL
  Administered 2013-07-04: 1 via ORAL
  Administered 2013-07-05 (×2): 2 via ORAL
  Filled 2013-07-03: qty 2
  Filled 2013-07-03: qty 1
  Filled 2013-07-03: qty 2
  Filled 2013-07-03: qty 1
  Filled 2013-07-03: qty 2
  Filled 2013-07-03: qty 1
  Filled 2013-07-03 (×5): qty 2

## 2013-07-03 MED ORDER — EPHEDRINE SULFATE 50 MG/ML IJ SOLN
INTRAMUSCULAR | Status: AC
  Filled 2013-07-03: qty 1

## 2013-07-03 MED ORDER — LIDOCAINE HCL (CARDIAC) 20 MG/ML IV SOLN
INTRAVENOUS | Status: AC
  Filled 2013-07-03: qty 5

## 2013-07-03 MED ORDER — HYDROMORPHONE HCL PF 1 MG/ML IJ SOLN: 0.2500 mg | INTRAMUSCULAR | Status: DC | PRN

## 2013-07-03 MED ORDER — TRANEXAMIC ACID 100 MG/ML IV SOLN
1000.0000 mg | Freq: Once | INTRAVENOUS | Status: AC
  Administered 2013-07-03: 1000 mg via INTRAVENOUS
  Filled 2013-07-03: qty 10

## 2013-07-03 MED ORDER — CEFAZOLIN SODIUM 1-5 GM-% IV SOLN
1.0000 g | Freq: Four times a day (QID) | INTRAVENOUS | Status: AC
  Administered 2013-07-03 (×2): 1 g via INTRAVENOUS
  Filled 2013-07-03 (×2): qty 50

## 2013-07-03 MED ORDER — OXYBUTYNIN CHLORIDE ER 10 MG PO TB24
10.0000 mg | ORAL_TABLET | Freq: Every day | ORAL | Status: DC
  Administered 2013-07-03 – 2013-07-04 (×2): 10 mg via ORAL
  Filled 2013-07-03 (×3): qty 1

## 2013-07-03 MED ORDER — SODIUM CHLORIDE 0.9 % IJ SOLN
INTRAMUSCULAR | Status: AC
  Filled 2013-07-03: qty 10

## 2013-07-03 MED ORDER — DOCUSATE SODIUM 100 MG PO CAPS
100.0000 mg | ORAL_CAPSULE | Freq: Two times a day (BID) | ORAL | Status: DC
  Administered 2013-07-03 – 2013-07-05 (×4): 100 mg via ORAL

## 2013-07-03 MED ORDER — PROMETHAZINE HCL 25 MG/ML IJ SOLN: 6.2500 mg | INTRAMUSCULAR | Status: DC | PRN

## 2013-07-03 MED ORDER — DIPHENHYDRAMINE HCL 12.5 MG/5ML PO ELIX: 25.0000 mg | ORAL_SOLUTION | Freq: Four times a day (QID) | ORAL | Status: DC | PRN

## 2013-07-03 MED ORDER — MENTHOL 3 MG MT LOZG
1.0000 | LOZENGE | OROMUCOSAL | Status: DC | PRN
  Filled 2013-07-03: qty 9

## 2013-07-03 MED ORDER — ONDANSETRON HCL 4 MG PO TABS: 4.0000 mg | ORAL_TABLET | Freq: Four times a day (QID) | ORAL | Status: DC | PRN

## 2013-07-03 MED ORDER — POLYETHYLENE GLYCOL 3350 17 G PO PACK: 17.0000 g | PACK | Freq: Every day | ORAL | Status: DC | PRN

## 2013-07-03 MED ORDER — HYOSCYAMINE SULFATE 0.125 MG SL SUBL
0.1250 mg | SUBLINGUAL_TABLET | SUBLINGUAL | Status: DC | PRN
  Filled 2013-07-03: qty 1

## 2013-07-03 MED ORDER — DEXTROSE 5 % IV SOLN
500.0000 mg | Freq: Four times a day (QID) | INTRAVENOUS | Status: DC | PRN
  Administered 2013-07-03: 500 mg via INTRAVENOUS
  Filled 2013-07-03: qty 5

## 2013-07-03 MED ORDER — METHOCARBAMOL 500 MG PO TABS
500.0000 mg | ORAL_TABLET | Freq: Four times a day (QID) | ORAL | Status: DC | PRN
  Administered 2013-07-03 – 2013-07-04 (×3): 500 mg via ORAL
  Filled 2013-07-03 (×4): qty 1

## 2013-07-03 MED ORDER — ROCURONIUM BROMIDE 100 MG/10ML IV SOLN
INTRAVENOUS | Status: AC
  Filled 2013-07-03: qty 1

## 2013-07-03 MED ORDER — FERROUS SULFATE 325 (65 FE) MG PO TABS
325.0000 mg | ORAL_TABLET | Freq: Three times a day (TID) | ORAL | Status: DC
  Administered 2013-07-03 – 2013-07-05 (×4): 325 mg via ORAL
  Filled 2013-07-03 (×8): qty 1

## 2013-07-03 MED ORDER — SODIUM CHLORIDE 0.9 % IV SOLN
INTRAVENOUS | Status: DC
  Administered 2013-07-03 (×2): via INTRAVENOUS
  Filled 2013-07-03 (×8): qty 1000

## 2013-07-03 MED ORDER — EPHEDRINE SULFATE 50 MG/ML IJ SOLN
INTRAMUSCULAR | Status: DC | PRN
  Administered 2013-07-03: 10 mg via INTRAVENOUS
  Administered 2013-07-03: 5 mg via INTRAVENOUS
  Administered 2013-07-03: 10 mg via INTRAVENOUS

## 2013-07-03 MED ORDER — LACTATED RINGERS IV SOLN
INTRAVENOUS | Status: DC | PRN
  Administered 2013-07-03 (×2): via INTRAVENOUS

## 2013-07-03 MED ORDER — ONDANSETRON HCL 4 MG/2ML IJ SOLN
INTRAMUSCULAR | Status: AC
  Filled 2013-07-03: qty 2

## 2013-07-03 MED ORDER — PROPOFOL 10 MG/ML IV EMUL
INTRAVENOUS | Status: DC | PRN
  Administered 2013-07-03: 20 mg via INTRAVENOUS

## 2013-07-03 MED ORDER — ONDANSETRON HCL 4 MG/2ML IJ SOLN: 4.0000 mg | Freq: Four times a day (QID) | INTRAMUSCULAR | Status: DC | PRN

## 2013-07-03 MED ORDER — PHENOL 1.4 % MT LIQD
1.0000 | OROMUCOSAL | Status: DC | PRN
  Filled 2013-07-03: qty 177

## 2013-07-03 MED ORDER — DEXAMETHASONE SODIUM PHOSPHATE 10 MG/ML IJ SOLN
INTRAMUSCULAR | Status: AC
  Filled 2013-07-03: qty 1

## 2013-07-03 MED ORDER — 0.9 % SODIUM CHLORIDE (POUR BTL) OPTIME
TOPICAL | Status: DC | PRN
  Administered 2013-07-03: 1000 mL

## 2013-07-03 MED ORDER — CHLORHEXIDINE GLUCONATE 4 % EX LIQD
60.0000 mL | Freq: Once | CUTANEOUS | Status: DC
Start: 2013-07-04 — End: 2013-07-03

## 2013-07-03 MED ORDER — SENNA 8.6 MG PO TABS
1.0000 | ORAL_TABLET | Freq: Two times a day (BID) | ORAL | Status: DC
  Administered 2013-07-03 – 2013-07-05 (×4): 8.6 mg via ORAL

## 2013-07-03 SURGICAL SUPPLY — 41 items
ADH SKN CLS APL DERMABOND .7 (GAUZE/BANDAGES/DRESSINGS) ×1
BAG SPEC THK2 15X12 ZIP CLS (MISCELLANEOUS)
BAG ZIPLOCK 12X15 (MISCELLANEOUS) IMPLANT
BLADE SAW SGTL 18X1.27X75 (BLADE) ×2 IMPLANT
CAPT HIP PF COP ×1 IMPLANT
DERMABOND ADVANCED (GAUZE/BANDAGES/DRESSINGS) ×1
DERMABOND ADVANCED .7 DNX12 (GAUZE/BANDAGES/DRESSINGS) ×1 IMPLANT
DRAPE C-ARM 42X120 X-RAY (DRAPES) ×2 IMPLANT
DRAPE STERI IOBAN 125X83 (DRAPES) ×2 IMPLANT
DRAPE U-SHAPE 47X51 STRL (DRAPES) ×6 IMPLANT
DRSG AQUACEL AG ADV 3.5X10 (GAUZE/BANDAGES/DRESSINGS) ×2 IMPLANT
DRSG TEGADERM 4X4.75 (GAUZE/BANDAGES/DRESSINGS) IMPLANT
DURAPREP 26ML APPLICATOR (WOUND CARE) ×2 IMPLANT
ELECT BLADE TIP CTD 4 INCH (ELECTRODE) ×2 IMPLANT
ELECT REM PT RETURN 9FT ADLT (ELECTROSURGICAL) ×2
ELECTRODE REM PT RTRN 9FT ADLT (ELECTROSURGICAL) ×1 IMPLANT
EVACUATOR 1/8 PVC DRAIN (DRAIN) IMPLANT
FACESHIELD LNG OPTICON STERILE (SAFETY) ×8 IMPLANT
GAUZE SPONGE 2X2 8PLY STRL LF (GAUZE/BANDAGES/DRESSINGS) IMPLANT
GLOVE BIOGEL PI IND STRL 7.5 (GLOVE) ×1 IMPLANT
GLOVE BIOGEL PI IND STRL 8 (GLOVE) ×1 IMPLANT
GLOVE BIOGEL PI INDICATOR 7.5 (GLOVE) ×1
GLOVE BIOGEL PI INDICATOR 8 (GLOVE) ×1
GLOVE ECLIPSE 8.0 STRL XLNG CF (GLOVE) ×2 IMPLANT
GLOVE ORTHO TXT STRL SZ7.5 (GLOVE) ×4 IMPLANT
GOWN SPEC L3 XXLG W/TWL (GOWN DISPOSABLE) ×3 IMPLANT
GOWN STRL REUS W/ TWL XL LVL3 (GOWN DISPOSABLE) IMPLANT
GOWN STRL REUS W/TWL LRG LVL3 (GOWN DISPOSABLE) ×3 IMPLANT
GOWN STRL REUS W/TWL XL LVL3 (GOWN DISPOSABLE) ×2
KIT BASIN OR (CUSTOM PROCEDURE TRAY) ×2 IMPLANT
PACK TOTAL JOINT (CUSTOM PROCEDURE TRAY) ×2 IMPLANT
PADDING CAST COTTON 6X4 STRL (CAST SUPPLIES) ×2 IMPLANT
SPONGE GAUZE 2X2 STER 10/PKG (GAUZE/BANDAGES/DRESSINGS)
SUT MNCRL AB 4-0 PS2 18 (SUTURE) ×2 IMPLANT
SUT VIC AB 1 CT1 36 (SUTURE) ×6 IMPLANT
SUT VIC AB 2-0 CT1 27 (SUTURE) ×4
SUT VIC AB 2-0 CT1 TAPERPNT 27 (SUTURE) ×2 IMPLANT
SUT VLOC 180 0 24IN GS25 (SUTURE) ×2 IMPLANT
TOWEL OR 17X26 10 PK STRL BLUE (TOWEL DISPOSABLE) ×2 IMPLANT
TOWEL OR NON WOVEN STRL DISP B (DISPOSABLE) ×1 IMPLANT
TRAY FOLEY CATH 14FRSI W/METER (CATHETERS) ×1 IMPLANT

## 2013-07-03 NOTE — Anesthesia Postprocedure Evaluation (Signed)
  Anesthesia Post-op Note  Patient: Anne [name]  Procedure(s) Performed: Procedure(s) (LRB): LEFT TOTAL HIP ARTHROPLASTY ANTERIOR APPROACH (Left)  Patient Location: PACU  Anesthesia Type: Spinal  Level of Consciousness: awake and alert   Airway and Oxygen Therapy: Patient Spontanous Breathing  Post-op Pain: mild  Post-op Assessment: Post-op Vital signs reviewed, Patient's Cardiovascular Status Stable, Respiratory Function Stable, Patent Airway and No signs of Nausea or vomiting  Last Vitals:  Filed Vitals:   07/03/13 1210  BP: 133/68  Pulse: 102  Temp: 36.9 C  Resp: 16    Post-op Vital Signs: stable   Complications: No apparent anesthesia complications

## 2013-07-03 NOTE — Op Note (Signed)
NAME:  Anne [name]                ACCOUNT NO.: [id number]      MEDICAL RECORD NO.: [id number]      FACILITY:  [facility]      PHYSICIAN:  Durene Romans D  DATE OF BIRTH:  [birth year]     DATE OF PROCEDURE:  07/03/2013                                 OPERATIVE REPORT         PREOPERATIVE DIAGNOSIS: Left  hip osteoarthritis.      POSTOPERATIVE DIAGNOSIS:  Left hip osteoarthritis.      PROCEDURE:  Left total hip replacement through an anterior approach   utilizing DePuy THR system, component size 52mm pinnacle cup, a size 36+4 neutral   Altrex liner, a size 7 Hi Tri Lock stem with a 36+5 delta ceramic   ball.      SURGEON:  Madlyn Frankel. Charlann Boxer, M.D.      ASSISTANT:  Leilani Able, PA-C      ANESTHESIA:  Spinal.      SPECIMENS:  None.      COMPLICATIONS:  None.      BLOOD LOSS:  500 cc     DRAINS:  None      INDICATION OF THE PROCEDURE:  Anne [name] is a 73 y.o. female who had   presented to office for evaluation of left hip pain.  Radiographs revealed   progressive degenerative changes with bone-on-bone   articulation to the  hip joint.  The patient had painful limited range of   motion significantly affecting their overall quality of life.  The patient was failing to    respond to conservative measures, and at this point was ready   to proceed with more definitive measures.  The patient has noted progressive   degenerative changes in his hip, progressive problems and dysfunction   with regarding the hip prior to surgery.  Consent was obtained for   benefit of pain relief.  Specific risk of infection, DVT, component   failure, dislocation, need for revision surgery, as well discussion of   the anterior versus posterior approach were reviewed.  Consent was   obtained for benefit of anterior pain relief through an anterior   approach.      PROCEDURE IN DETAIL:  The patient was brought to operative theater.   Once adequate anesthesia, preoperative  antibiotics, 2gm Ancef administered.   The patient was positioned supine on the OSI Hanna table.  Once adequate   padding of boney process was carried out, we had predraped out the hip, and  used fluoroscopy to confirm orientation of the pelvis and position.      The left hip was then prepped and draped from proximal iliac crest to   mid thigh with shower curtain technique.      Time-out was performed identifying the patient, planned procedure, and   extremity.     An incision was then made 2 cm distal and lateral to the   anterior superior iliac spine extending over the orientation of the   tensor fascia lata muscle and sharp dissection was carried down to the   fascia of the muscle and protractor placed in the soft tissues.      The fascia was then incised.  The muscle belly was identified and swept  laterally and retractor placed along the superior neck.  Following   cauterization of the circumflex vessels and removing some pericapsular   fat, a second cobra retractor was placed on the inferior neck.  A third   retractor was placed on the anterior acetabulum after elevating the   anterior rectus.  A L-capsulotomy was along the line of the   superior neck to the trochanteric fossa, then extended proximally and   distally.  Tag sutures were placed and the retractors were then placed   intracapsular.  We then identified the trochanteric fossa and   orientation of my neck cut, confirmed this radiographically   and then made a neck osteotomy with the femur on traction.  The femoral   head was removed without difficulty or complication.  Traction was let   off and retractors were placed posterior and anterior around the   acetabulum.      The labrum and foveal tissue were debrided.  I began reaming with a 47mm   reamer and reamed up to 51mm reamer with good bony bed preparation and a 52   cup was chosen.  The final 52mm Pinnacle cup was then impacted under fluoroscopy  to confirm the  depth of penetration and orientation with respect to   abduction.  A screw was placed followed by the hole eliminator.  The final   36+4 neutral Altrex liner was impacted with good visualized rim fit.  The cup was positioned anatomically within the acetabular portion of the pelvis.      At this point, the femur was rolled at 80 degrees.  Further capsule was   released off the inferior aspect of the femoral neck.  I then   released the superior capsule proximally.  The hook was placed laterally   along the femur and elevated manually and held in position with the bed   hook.  The leg was then extended and adducted with the leg rolled to 100   degrees of external rotation.  Once the proximal femur was fully   exposed, I used a box osteotome to set orientation.  I then began   broaching with the starting chili pepper broach and passed this by hand and then broached up to 7.  With the 7 broach in place I chose a high offset neck and did a trial reduction first with the +1.5 then +5 head ball.  The offset was appropriate, leg lengths   appeared to be equal, confirmed radiographically.   Given these findings, I went ahead and dislocated the hip, repositioned all   retractors and positioned the right hip in the extended and abducted position.  The final 7 Hi Tri Lock stem was   chosen and it was impacted down to the level of neck cut.  Based on this   and the trial reduction, a 36+5 delta ceramic ball was chosen and   impacted onto a clean and dry trunnion, and the hip was reduced.  The   hip had been irrigated throughout the case again at this point.  I did   reapproximate the superior capsular leaflet to the anterior leaflet   using #1 Vicryl.  The fascia of the   tensor fascia lata muscle was then reapproximated using #1 Vicryl and #0 V-lock sutures.  The   remaining wound was closed with 2-0 Vicryl and running 4-0 Monocryl.   The hip was cleaned, dried, and dressed sterilely using Dermabond and    Aquacel dressing.  She  was then brought   to recovery room in stable condition tolerating the procedure well.    Leilani Able, PA-C was present for the entirety of the case involved from   preoperative positioning, perioperative retractor management, general   facilitation of the case, as well as primary wound closure as assistant.            Madlyn Frankel Charlann Boxer, M.D.        07/03/2013 9:15 AM

## 2013-07-03 NOTE — Evaluation (Signed)
Physical Therapy Evaluation Patient Details Name: Anne [name] MRN: [id number] DOB: 10-05-1940 Today's Date: 07/03/2013 Time: 4098-1191 PT Time Calculation (min): 26 min  PT Assessment / Plan / Recommendation History of Present Illness  73 yo female s/p L THA-DA 07/03/13. Hx of dizziness.   Clinical Impression  On eval, pt required Mod assist for mobility-able to ambulate ~50 feet with walker on POD 0. Pt states she does not want SNF and she plans to d/c to a friend's home (with the friend assisting her). Recommend HHPT, 24/7 care at this time. Pt may progress to needing less supervision, depending on length of hospital stay.    PT Assessment  Patient needs continued PT services    Follow Up Recommendations  Home health PT;Supervision/Assistance - 24 hour    Does the patient have the potential to tolerate intense rehabilitation      Barriers to Discharge        Equipment Recommendations  None recommended by PT (pt states she has walker, raised toilet seat)    Recommendations for Other Services OT consult   Frequency 7X/week    Precautions / Restrictions Precautions Precautions: Fall Restrictions Weight Bearing Restrictions: No LLE Weight Bearing: Weight bearing as tolerated   Pertinent Vitals/Pain 5/10 L hip       Mobility  Bed Mobility Overal bed mobility: Needs Assistance Bed Mobility: Supine to Sit Supine to sit: Mod assist General bed mobility comments: assist for L LE and trunk to upright. Increased time. VCs safety, hand placement.  Transfers Overall transfer level: Needs assistance Transfers: Sit to/from Stand Sit to Stand: Mod assist;From elevated surface General transfer comment: Assist to rise, stabilize, control descent. VCs safety, technique, hand placement.  Ambulation/Gait Ambulation/Gait assistance: Min assist Ambulation Distance (Feet): 50 Feet Assistive device: Rolling walker (2 wheeled) Gait Pattern/deviations: Decreased stride  length;Antalgic;Step-to pattern;Decreased step length - left General Gait Details: slow gait speed. VCS safety, technique, sequence, distance from walker, L foot/LE positioning-pt tends to want to IR foot.     Exercises     PT Diagnosis: Difficulty walking;Acute pain;Abnormality of gait  PT Problem List: Decreased strength;Decreased range of motion;Decreased activity tolerance;Decreased balance;Decreased mobility;Pain;Decreased knowledge of use of DME PT Treatment Interventions: DME instruction;Gait training;Stair training;Functional mobility training;Therapeutic activities;Therapeutic exercise;Patient/family education;Balance training     PT Goals(Current goals can be found in the care plan section) Acute Rehab PT Goals Patient Stated Goal: home soon.  PT Goal Formulation: With patient Time For Goal Achievement: 07/10/13 Potential to Achieve Goals: Good  Visit Information  Last PT Received On: 07/03/13 Assistance Needed: +1 History of Present Illness: 73 yo female s/p L THA-DA 07/03/13. Hx of dizziness.        Prior Functioning  Home Living Family/patient expects to be discharged to:: Private residence (Friend's home) Living Arrangements: Non-relatives/Friends Available Help at Discharge: Friend(s) Type of Home: House Home Access: Stairs to enter [employer] of Steps: 3 Entrance Stairs-Rails: None Home Layout: One level Home Equipment: Environmental consultant - 2 wheels;Bedside commode Prior Function Level of Independence: Independent Communication Communication: No difficulties    Cognition  Cognition Arousal/Alertness: Awake/alert Behavior During Therapy: WFL for tasks assessed/performed Overall Cognitive Status: Within Functional Limits for tasks assessed    Extremity/Trunk Assessment Upper Extremity Assessment Upper Extremity Assessment: Overall WFL for tasks assessed Lower Extremity Assessment Lower Extremity Assessment: LLE deficits/detail LLE Deficits / Details: hip  flex 2/5, hip abd/add 2/5, moves anke well Cervical / Trunk Assessment Cervical / Trunk Assessment: Normal   Balance  End of Session PT - End of Session Activity Tolerance: Patient tolerated treatment well Patient left: in chair;with call bell/phone within reach;with family/visitor present  GP     Rebeca AlertJannie Leonidas Boateng, MPT Pager: [phone]

## 2013-07-03 NOTE — Plan of Care (Signed)
Problem: Consults Goal: Diagnosis- Total Joint Replacement Primary Total Hip     

## 2013-07-03 NOTE — Anesthesia Procedure Notes (Signed)
Spinal  Patient location during procedure: OR Staffing Anesthesiologist: Zarie Kosiba J Performed by: anesthesiologist  Preanesthetic Checklist Completed: patient identified, site marked, surgical consent, pre-op evaluation, timeout performed, IV checked, risks and benefits discussed and monitors and equipment checked Spinal Block Patient position: sitting Prep: Betadine Patient monitoring: heart rate, continuous pulse ox and blood pressure Approach: midline Location: L3-4 Injection technique: single-shot Needle Needle type: Spinocan  Needle gauge: 22 G Needle length: 9 cm Additional Notes Expiration date of kit checked and confirmed. Patient tolerated procedure well, without complications. CSF clear. No paresthesia.     

## 2013-07-03 NOTE — Preoperative (Signed)
Beta Blockers   Reason not to administer Beta Blockers:Not Applicable 

## 2013-07-03 NOTE — Interval H&P Note (Signed)
History and Physical Interval Note:  07/03/2013 7:52 AM  Anne [name]  has presented today for surgery, with the diagnosis of LEFT HIP OSTEOARTHRITIS  The various methods of treatment have been discussed with the patient and family. After consideration of risks, benefits and other options for treatment, the patient has consented to  Procedure(s): LEFT TOTAL HIP ARTHROPLASTY ANTERIOR APPROACH (Left) as a surgical intervention .  The patient's history has been reviewed, patient examined, no change in status, stable for surgery.  I have reviewed the patient's chart and labs.  Questions were answered to the patient's satisfaction.     Shelda PalLIN,Shaiden Aldous D

## 2013-07-03 NOTE — Progress Notes (Signed)
Utilization review completed.  

## 2013-07-03 NOTE — Transfer of Care (Signed)
Immediate Anesthesia Transfer of Care Note  Patient: Anne [name]  Procedure(s) Performed: Procedure(s): LEFT TOTAL HIP ARTHROPLASTY ANTERIOR APPROACH (Left)  Patient Location: PACU  Anesthesia Type:Spinal  Level of Consciousness: awake, alert  and oriented  Airway & Oxygen Therapy: Patient Spontanous Breathing and Patient connected to face mask oxygen  Post-op Assessment: Report given to PACU RN and Post -op Vital signs reviewed and stable  Post vital signs: Reviewed and stable  Complications: No apparent anesthesia complications

## 2013-07-04 DIAGNOSIS — E663 Overweight: Secondary | ICD-10-CM | POA: Diagnosis present

## 2013-07-04 DIAGNOSIS — D500 Iron deficiency anemia secondary to blood loss (chronic): Secondary | ICD-10-CM | POA: Diagnosis not present

## 2013-07-04 LAB — BASIC METABOLIC PANEL
BUN: 9 mg/dL (ref 6–23)
CHLORIDE: 105 meq/L (ref 96–112)
CO2: 27 mEq/L (ref 19–32)
Calcium: 8.1 mg/dL — ABNORMAL LOW (ref 8.4–10.5)
Creatinine, Ser: 0.63 mg/dL (ref 0.50–1.10)
GFR calc Af Amer: >90 mL/min (ref >90)
GFR, EST NON AFRICAN AMERICAN: 89 mL/min — AB (ref >90)
Glucose, Bld: 121 mg/dL — ABNORMAL HIGH (ref 70–99)
POTASSIUM: 4.6 meq/L (ref 3.7–5.3)
SODIUM: 141 meq/L (ref 137–147)

## 2013-07-04 LAB — CBC
HCT: 28.8 % — ABNORMAL LOW (ref 36.0–46.0)
Hemoglobin: 9.7 g/dL — ABNORMAL LOW (ref 12.0–15.0)
MCH: 31.7 pg (ref 26.0–34.0)
MCHC: 33.7 g/dL (ref 30.0–36.0)
MCV: 94.1 fL (ref 78.0–100.0)
PLATELETS: 123 10*3/uL — AB (ref 150–400)
RBC: 3.06 MIL/uL — ABNORMAL LOW (ref 3.87–5.11)
RDW: 13.3 % (ref 11.5–15.5)
WBC: 9 10*3/uL (ref 4.0–10.5)

## 2013-07-04 MED ORDER — HYDROCODONE-ACETAMINOPHEN 7.5-325 MG PO TABS: 1.0000 | ORAL_TABLET | ORAL | Status: DC

## 2013-07-04 MED ORDER — FERROUS SULFATE 325 (65 FE) MG PO TABS: 325.0000 mg | ORAL_TABLET | Freq: Three times a day (TID) | ORAL | Status: DC

## 2013-07-04 MED ORDER — POLYETHYLENE GLYCOL 3350 17 G PO PACK: 17.0000 g | PACK | Freq: Every day | ORAL | Status: DC | PRN

## 2013-07-04 MED ORDER — ASPIRIN 325 MG PO TBEC: 325.0000 mg | DELAYED_RELEASE_TABLET | Freq: Two times a day (BID) | ORAL | Status: AC

## 2013-07-04 MED ORDER — TIZANIDINE HCL 4 MG PO CAPS: 4.0000 mg | ORAL_CAPSULE | Freq: Three times a day (TID) | ORAL | Status: DC | PRN

## 2013-07-04 MED ORDER — DSS 100 MG PO CAPS: 100.0000 mg | ORAL_CAPSULE | Freq: Two times a day (BID) | ORAL | Status: DC

## 2013-07-04 NOTE — Evaluation (Signed)
Occupational Therapy Evaluation Patient Details Name: Anne MorinGeorgina C [name] MRN: [id number] DOB: 1940-11-04 Today's Date: 07/04/2013 Time: 7829-56210939-1020 OT Time Calculation (min): 41 min  OT Assessment / Plan / Recommendation History of present illness 73 yo female s/p L THA-DA 07/03/13. Hx of dizziness.    Clinical Impression   Pt had dizzy spell X2 during session with sit to stand transitions. Informed nursing that pt may benefit from an extra day on acute. Will benefit from continued OT services to maximize ADL independence for d/c home with friend.    OT Assessment  Patient needs continued OT Services    Follow Up Recommendations  Home health OT;Supervision/Assistance - 24 hour    Barriers to Discharge      Equipment Recommendations  None recommended by OT (as long as friend has 3in1)    Recommendations for Other Services    Frequency  Min 2X/week    Precautions / Restrictions Precautions Precautions: Fall Precaution Comments: history of dizziness Restrictions Weight Bearing Restrictions: No LLE Weight Bearing: Weight bearing as tolerated   Pertinent Vitals/Pain 133/66 sitting 3/10 L hip; reposition, ice    ADL  Eating/Feeding: Independent Where Assessed - Eating/Feeding: Chair Grooming: Wash/dry hands;Set up Where Assessed - Grooming: Supported sitting Upper Body Bathing: Chest;Right arm;Left arm;Abdomen;Set up;Supervision/safety Where Assessed - Upper Body Bathing: Unsupported sitting Lower Body Bathing: Moderate assistance Where Assessed - Lower Body Bathing: Supported sit to stand Upper Body Dressing: Set up;Supervision/safety Where Assessed - Upper Body Dressing: Unsupported sitting Lower Body Dressing: Maximal assistance Where Assessed - Lower Body Dressing: Supported sit to stand Toilet Transfer: Minimal assistance AcupuncturistToilet Transfer Equipment: Raised toilet seat with arms (or 3-in-1 over toilet) Toileting - Clothing Manipulation and Hygiene: Minimal assistance Where  Assessed - Glass blower/designerToileting Clothing Manipulation and Hygiene: Sit to stand from 3-in-1 or toilet Equipment Used: Rolling walker;Long-handled shoe horn;Reacher;Long-handled sponge;Sock aid ADL Comments: Demonstrated all AE and pt states her friend can hep with LB self care. Discussed use of 3in1 as shower chair also and states her friend's spouse can put 3in1 into shower when she wants to shower as friend has limited ability to lift objects. Pt with dizzy epidoes twice during session each time with sit to stand from recliner/3in1. Had to sit back down both times and recover. BP 133/66. Nursing made aware. Informed nursing of OT recommendation to stay another day to increase independence with self care tasks.     OT Diagnosis: Generalized weakness  OT Problem List: Decreased strength;Decreased knowledge of use of DME or AE OT Treatment Interventions: Self-care/ADL training;DME and/or AE instruction;Therapeutic activities;Patient/family education   OT Goals(Current goals can be found in the care plan section) Acute Rehab OT Goals Patient Stated Goal: be independent OT Goal Formulation: With patient Time For Goal Achievement: 07/11/13 Potential to Achieve Goals: Good  Visit Information  Last OT Received On: 07/04/13 Assistance Needed: +1 History of Present Illness: 73 yo female s/p L THA-DA 07/03/13. Hx of dizziness.        Prior Functioning     Home Living Family/patient expects to be discharged to:: Private residence (Friend's home) Living Arrangements: Non-relatives/Friends Available Help at Discharge: Friend(s) Type of Home: House Home Access: Stairs to enter [employer] CorporationEntrance Stairs-Number of Steps: 3 Entrance Stairs-Rails: None Home Layout: One level Home Equipment: Environmental consultantWalker - 2 wheels;Bedside commode Prior Function Level of Independence: Independent Communication Communication: No difficulties         Vision/Perception     Cognition  Cognition Arousal/Alertness:  Awake/alert Behavior During Therapy: WFL for tasks  assessed/performed Overall Cognitive Status: Within Functional Limits for tasks assessed    Extremity/Trunk Assessment       Mobility  Transfers Overall transfer level: Needs assistance Equipment used: Rolling walker (2 wheeled) Transfers: Sit to/from Stand Sit to Stand: Min assist General transfer comment: verbal cues for hand placement and assist to rise and steady as well as control descent.        Balance     End of Session OT - End of Session Equipment Utilized During Treatment: Rolling walker Activity Tolerance: Other (comment) (dizziness) Patient left: in chair;with call bell/phone within reach  GO     Lennox Laity [phone] 07/04/2013, 10:37 AM

## 2013-07-04 NOTE — Progress Notes (Signed)
   Subjective: 1 Day Post-Op Procedure(s) (LRB): LEFT TOTAL HIP ARTHROPLASTY ANTERIOR APPROACH (Left)   Patient reports pain as mild, pain controlled. No events throughout the night. Ready to be discharged home is pain is controlled and does well with PT.  Objective:   VITALS:   Filed Vitals:   07/04/13 0636  BP: 110/70  Pulse: 67  Temp: 97.9 F (36.6 C)  Resp: 17    Neurovascular intact Dorsiflexion/Plantar flexion intact Incision: dressing C/D/I No cellulitis present Compartment soft  LABS  Recent Labs  07/04/13 0442  HGB 9.7*  HCT 28.8*  WBC 9.0  PLT 123*     Recent Labs  07/04/13 0442  NA 141  K 4.6  BUN 9  CREATININE 0.63  GLUCOSE 121*     Assessment/Plan: 1 Day Post-Op Procedure(s) (LRB): LEFT TOTAL HIP ARTHROPLASTY ANTERIOR APPROACH (Left) Foley cath d/c'ed Advance diet Up with therapy D/C IV fluids Discharge home with home health [facility] Follow up with OLIN,Zurisadai Helminiak D in 2 weeks.  Contact information:  [facility] [address], [address] [place] [id number] [phone]    Expected ABLA  Treated with iron and will observe  Overweight (BMI 25-29.9) Estimated body mass index is 27.16 kg/(m^2) as calculated from the following:   Height as of this encounter: 5' 6.5" (1.689 m).   Weight as of this encounter: 77.474 kg (170 lb 12.8 oz). Patient also counseled that weight may inhibit the healing process Patient counseled that losing weight will help with future health issues      Anastasio AuerbachMatthew S. Deandra Goering   PAC  07/04/2013, 9:28 AM

## 2013-07-04 NOTE — Progress Notes (Signed)
Physical Therapy Treatment Patient Details Name: Anne MorinGeorgina C [name] MRN: [id number] DOB: [birth year] Today's Date: 07/04/2013 Time: [id number] PT Time Calculation (min): 35 min  PT Assessment / Plan / Recommendation  History of Present Illness 73 yo female s/p L THA-DA 07/03/13. Hx of dizziness.    PT Comments   Progressing with mobility. Pt states she may possibly d/c home later today. Will plan to practice steps this afternoon during 2nd session.   Follow Up Recommendations  Home health PT;Supervision/Assistance - 24 hour     Does the patient have the potential to tolerate intense rehabilitation     Barriers to Discharge        Equipment Recommendations  None recommended by PT    Recommendations for Other Services OT consult  Frequency 7X/week   Progress towards PT Goals Progress towards PT goals: Progressing toward goals  Plan Current plan remains appropriate    Precautions / Restrictions Precautions Precautions: Fall Restrictions Weight Bearing Restrictions: No LLE Weight Bearing: Weight bearing as tolerated   Pertinent Vitals/Pain L hip 3/10 with activity    Mobility  Bed Mobility Overal bed mobility: Needs Assistance Bed Mobility: Supine to Sit Supine to sit: Min assist General bed mobility comments: Assist for  L LE. Increased time.  Transfers Overall transfer level: Needs assistance Transfers: Sit to/from Stand Sit to Stand: Min assist General transfer comment: Assist to rise, stabilize, control descent. VCs safety, technique, hand placement.  Ambulation/Gait Ambulation/Gait assistance: Min guard Ambulation Distance (Feet): 175 Feet Assistive device: Rolling walker (2 wheeled) Gait Pattern/deviations: Decreased stride length;Step-to pattern;Antalgic General Gait Details: slow gait speed. VCS safety, technique, sequence, distance from walker, L foot/LE positioning-pt tends to want to IR foot.     Exercises Total Joint Exercises Ankle Circles/Pumps: AROM;Both;15  reps;Supine Quad Sets: AROM;Both;15 reps;Supine Heel Slides: AAROM;Left;15 reps;Supine Hip ABduction/ADduction: AAROM;Left;15 reps;Supine   PT Diagnosis:    PT Problem List:   PT Treatment Interventions:     PT Goals (current goals can now be found in the care plan section)    Visit Information  Last PT Received On: 07/04/13 Assistance Needed: +1 History of Present Illness: 73 yo female s/p L THA-DA 07/03/13. Hx of dizziness.     Subjective Data      Cognition  Cognition Arousal/Alertness: Awake/[name] Behavior During Therapy: WFL for tasks assessed/performed Overall Cognitive Status: Within Functional Limits for tasks assessed    Balance     End of Session PT - End of Session Equipment Utilized During Treatment: Gait belt Activity Tolerance: Patient tolerated treatment well Patient left: in chair;with call bell/phone within reach   GP     Anne AlertJannie Quintyn [name], MPT Pager: [phone]

## 2013-07-04 NOTE — Progress Notes (Signed)
Physical Therapy Treatment Patient Details Name: Anne MorinGeorgina C Murdaugh MRN: [id number] DOB: Sep 19, 1940 Today's Date: 07/04/2013 Time: 1201-1239 PT Time Calculation (min): 38 min  PT Assessment / Plan / Recommendation  History of Present Illness 73 yo female s/p L THA-DA 07/03/13. Hx of dizziness.    PT Comments   Progressing with mobility. Pt appears a little more fatigued this session compared to earlier this am. Continues to have dizziness with initial standing but this resolves quickly. Cues for pt to remain standing for a short period before taking off, in case she needs to sit back down. Will plan to practice stair negotiation on tomorrow morning.   Follow Up Recommendations  Home health PT;Supervision/Assistance - 24 hour     Does the patient have the potential to tolerate intense rehabilitation     Barriers to Discharge        Equipment Recommendations  None recommended by PT    Recommendations for Other Services OT consult  Frequency 7X/week   Progress towards PT Goals Progress towards PT goals: Progressing toward goals  Plan Current plan remains appropriate    Precautions / Restrictions Precautions Precautions: Fall Precaution Comments: history of dizziness Restrictions Weight Bearing Restrictions: No LLE Weight Bearing: Weight bearing as tolerated   Pertinent Vitals/Pain 5/10 L LE.     Mobility  Bed Mobility Overal bed mobility: Needs Assistance Bed Mobility: Supine to Sit Supine to sit: Min assist General bed mobility comments: Assist for  L LE. Increased time.  Transfers Overall transfer level: Needs assistance Equipment used: Rolling walker (2 wheeled) Transfers: Sit to/from Stand Sit to Stand: Min assist General transfer comment: verbal cues for hand placement and assist to rise and steady as well as control descent. Ambulation/Gait Ambulation/Gait assistance: Min guard Ambulation Distance (Feet): 150 Feet Assistive device: Rolling walker (2 wheeled) Gait  Pattern/deviations: Decreased stride length;Step-to pattern;Antalgic General Gait Details: slow gait speed. VCS safety, technique, sequence, distance from walker, L foot/LE positioning-pt tends to want to IR foot.     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 Hx of dizziness.     Subjective Data  Patient Stated Goal: be independent   Cognition  Cognition Arousal/Alertness: Awake/alert Behavior During Therapy: WFL for tasks assessed/performed Overall Cognitive Status: Within Functional Limits for tasks assessed    Balance     End of Session PT - End of Session Equipment Utilized During Treatment: Gait belt Activity Tolerance: Patient limited by fatigue Patient left: in bed;with call bell/phone within reach   GP     Rebeca AlertJannie Lorayne Getchell, MPT Pager: [phone]

## 2013-07-05 LAB — BASIC METABOLIC PANEL
BUN: 10 mg/dL (ref 6–23)
CO2: 28 mEq/L (ref 19–32)
Calcium: 8 mg/dL — ABNORMAL LOW (ref 8.4–10.5)
Chloride: 101 mEq/L (ref 96–112)
Creatinine, Ser: 0.71 mg/dL (ref 0.50–1.10)
GFR calc Af Amer: >90 mL/min (ref >90)
GFR calc non Af Amer: 85 mL/min — ABNORMAL LOW (ref >90)
Glucose, Bld: 120 mg/dL — ABNORMAL HIGH (ref 70–99)
Potassium: 3.9 mEq/L (ref 3.7–5.3)
Sodium: 138 mEq/L (ref 137–147)

## 2013-07-05 LAB — CBC
HCT: 28.4 % — ABNORMAL LOW (ref 36.0–46.0)
Hemoglobin: 9.1 g/dL — ABNORMAL LOW (ref 12.0–15.0)
MCH: 30.2 pg (ref 26.0–34.0)
MCHC: 32 g/dL (ref 30.0–36.0)
MCV: 94.4 fL (ref 78.0–100.0)
PLATELETS: 132 10*3/uL — AB (ref 150–400)
RBC: 3.01 MIL/uL — AB (ref 3.87–5.11)
RDW: 13.2 % (ref 11.5–15.5)
WBC: 8.8 10*3/uL (ref 4.0–10.5)

## 2013-07-05 NOTE — Progress Notes (Signed)
Pt stable, scripts, d/c instructions given with no questions/concerns voiced by pt or friends.  Pt transported via wheelchair to private vehicle by NT and friends.

## 2013-07-05 NOTE — Progress Notes (Signed)
[facility] the [facility]: RW and Commode  If patient discharges after hours, please call [phone].   Renard HamperLecretia Williamson 07/05/2013, 11:35 AM

## 2013-07-05 NOTE — Care Management Note (Signed)
    Page 1 of 2   07/05/2013     10:58:46 AM   CARE MANAGEMENT NOTE 07/05/2013  Patient:  Anne [name],Anne [name]   Account Number:  [id number]  Date Initiated:  07/05/2013  Documentation initiated by:  Colleen CanMANNING,Jahree Dermody  Subjective/Objective Assessment:   DX Left hip replacemnt-anterior approach.    [facility] which will [facility] tomorrow.     Action/Plan:   CM spoke with patient. Plans are for patient to go to friends home[place]. She will need  RW and 3N1. [facility] her [facility].   Anticipated DC Date:  07/05/2013   Anticipated DC Plan:  HOME W HOME HEALTH SERVICES      DC Planning Services  CM consult      [facility]  [facility]  DURABLE MEDICAL EQUIPMENT   Choice offered to / List presented to:  [name]-1 Patient   DME arranged  3-N-1  Levan HurstWALKER - ROLLING      DME agency  [facility]     HH arranged  HH-2 PT      [facility]   Status of service:  Completed, signed off Medicare Important Message given?  NA - LOS <3 / Initial given by admissions (If response is "NO", the following Medicare IM given date fields will be blank) Date Medicare IM given:   Date Additional Medicare IM given:    Discharge Disposition:  HOME W HOME HEALTH SERVICES  Per UR Regulation:    If discussed at Long Length of Stay Meetings, dates discussed:    Comments:

## 2013-07-05 NOTE — Progress Notes (Signed)
Physical Therapy Treatment Patient Details Name: Anne [name] MRN: [id number] DOB: 12/20/1940 Today's Date: 07/05/2013 Time: 0920-0959 PT Time Calculation (min): 39 min  PT Assessment / Plan / Recommendation  History of Present Illness 73 yo female s/p L THA-DA 07/03/13. Hx of dizziness.    PT Comments   Progressing with mobility. Pt plans to d/c today. Practiced exercises, gait and stair negotiation. Pt performed well. Ready to d/c home from PT standpoint. All education completed.   Follow Up Recommendations  Home health PT;Supervision/Assistance - 24 hour     Does the patient have the potential to tolerate intense rehabilitation     Barriers to Discharge        Equipment Recommendations  None recommended by PT    Recommendations for Other Services OT consult  Frequency 7X/week   Progress towards PT Goals Progress towards PT goals: Progressing toward goals  Plan Current plan remains appropriate    Precautions / Restrictions Precautions Precautions: Fall Precaution Comments: history of dizziness Restrictions Weight Bearing Restrictions: No LLE Weight Bearing: Weight bearing as tolerated   Pertinent Vitals/Pain 3/10 L hip with activity. Ice applied end of session    Mobility  Bed Mobility Overal bed mobility: Needs Assistance Bed Mobility: Supine to Sit Supine to sit: Min assist General bed mobility comments: pt sitting in recliner Transfers Overall transfer level: Needs assistance Equipment used: Rolling walker (2 wheeled) Transfers: Sit to/from Stand Sit to Stand: Supervision General transfer comment: VCs safety, hand placement Ambulation/Gait Ambulation/Gait assistance: Supervision Ambulation Distance (Feet): 175 Feet Assistive device: Rolling walker (2 wheeled) General Gait Details: slow gait speed. VCS safety, technique, sequence, distance from walker, L foot/LE positioning-pt tends to want to IR foot.  Stairs: Yes Stairs assistance: Min assist Stair  Management: Forwards;With crutches;Step to pattern Number of Stairs: 2 General stair comments: Performed with 1 crutch, 1 hand support of therapist. VCS safety, technique, sequence.     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 Hx of dizziness.     Subjective Data      Cognition  Cognition Arousal/Alertness: Awake/[name] Behavior During Therapy: WFL for tasks assessed/performed Overall Cognitive Status: Within Functional Limits for tasks assessed    Balance  General Comments General comments (skin integrity, edema, etc.): min to min guard for standing balance to manage clothing  End of Session PT - End of Session Equipment Utilized During Treatment: Gait belt Activity Tolerance: Patient tolerated treatment well Patient left: in chair;with call bell/phone within reach;with family/visitor present   GP     Anne AlertJannie Khole [name], MPT Pager: [phone]

## 2013-07-05 NOTE — Progress Notes (Signed)
   Subjective: 2 Days Post-Op Procedure(s) (LRB): LEFT TOTAL HIP ARTHROPLASTY ANTERIOR APPROACH (Left)   Patient reports pain as mild, pain controlled. No events throughout the night. States that she was able to sleep last night and feels better today. Ready to be discharged to her friend's home.  Objective:   VITALS:   Filed Vitals:   07/05/13 0500  BP: 100/58  Pulse: 69  Temp: 98.9 F (37.2 C)  Resp: 16    Neurovascular intact Dorsiflexion/Plantar flexion intact Incision: dressing C/D/I No cellulitis present Compartment soft  LABS  Recent Labs  07/04/13 0442 07/05/13 0443  HGB 9.7* 9.1*  HCT 28.8* 28.4*  WBC 9.0 8.8  PLT 123* 132*     Recent Labs  07/04/13 0442 07/05/13 0443  NA 141 138  K 4.6 3.9  BUN 9 10  CREATININE 0.63 0.71  GLUCOSE 121* 120*     Assessment/Plan: 2 Days Post-Op Procedure(s) (LRB): LEFT TOTAL HIP ARTHROPLASTY ANTERIOR APPROACH (Left) Up with therapy Discharge home with home health [facility]. Follow up with OLIN,Sahvanna Mcmanigal D in 2 weeks.  Contact information:  [facility] [address] [place] [id number] [phone]    Expected ABLA  Treated with iron and will observe   Overweight (BMI 25-29.9)  Estimated body mass index is 27.16 kg/(m^2) as calculated from the following:      Height as of this encounter: 5' 6.5" (1.689 m).      Weight as of this encounter: 77.474 kg (170 lb 12.8 oz).  Patient also counseled that weight may inhibit the healing process  Patient counseled that losing weight will help with future health issues        Anastasio AuerbachMatthew S. Teneisha Gignac   PAC  07/05/2013, 8:26 AM

## 2013-07-05 NOTE — Progress Notes (Signed)
Discharge summary sent to payer through [facility]  

## 2013-07-05 NOTE — Progress Notes (Signed)
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 Hx of dizziness.    OT comments  Pt doing much better today with no report of dizziness. Pt ready to d/c to her friends house. Discussed initial assist for safety with ADL/functional transfers and pt feels confident that she has enough assist at friend's house. She would like a 3in1 of her own. [facility] to progress ADL independence   Follow Up Recommendations  Home health OT;Supervision/Assistance - 24 hour    Barriers to Discharge       Equipment Recommendations  3 in 1 bedside comode (pt states she would like her own 3in1)    Recommendations for Other Services    Frequency Min 2X/week   Progress towards OT Goals Progress towards OT goals: Progressing toward goals  Plan Discharge plan remains appropriate    Precautions / Restrictions Precautions Precautions: Fall Precaution Comments: history of dizziness Restrictions Weight Bearing Restrictions: No LLE Weight Bearing: Weight bearing as tolerated   Pertinent Vitals/Pain 3/10 L hip; reposition; ice    ADL  Toilet Transfer: Min Psychologist, sport and exerciseguard Toilet Transfer Equipment: Raised toilet seat with arms (or 3-in-1 over toilet) Toileting - Clothing Manipulation and Hygiene: Min guard;Minimal assistance Where Assessed - Engineer, miningToileting Clothing Manipulation and Hygiene: Sit to stand from 3-in-1 or toilet Tub/Shower Transfer: Minimal assistance (step back over ledge) Equipment Used: Rolling walker ADL Comments: Pt attempting to don underwear from bed level when OT arrived. She is not interested in AE for LB self care and states her friend will assist with this. Reviewed sequencing for LB dressing even if friend assisting and safety with walker use during ADL. Pt  did well with transfer to 3in1 but min cues to keep R foot under her more before standing. She was min to min guard assist to pull up clothing (min when reaching all the way to ankle to retrieve pants). Issued shower transfer handout and reviewed and practiced.     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 Hx of dizziness.     Subjective Data      Prior Functioning       Cognition  Cognition Arousal/Alertness: Awake/alert Behavior During Therapy: WFL for tasks assessed/performed Overall Cognitive Status: Within Functional Limits for tasks assessed    Mobility  Bed Mobility Overal bed mobility: Needs Assistance Bed Mobility: Supine to Sit Supine to sit: Min assist General bed mobility comments: assist for L LE, HOB elevated Transfers Overall transfer level: Needs assistance Equipment used: Rolling walker (2 wheeled) Transfers: Sit to/from Stand Sit to Stand: Min guard General transfer comment: verbal cues for hand placement and keeping R foot under her more prior to standing    Exercises      Balance General Comments General comments (skin integrity, edema, etc.): min to min guard for standing balance to manage clothing  End of Session OT - End of Session Equipment Utilized During Treatment: Rolling walker Activity Tolerance: Patient tolerated treatment well Patient left: in chair;with call bell/phone within reach  GO  Lennox Laity [phone] 07/05/2013, 9:29 AM

## 2013-07-06 NOTE — Discharge Summary (Signed)
Physician Discharge Summary  Patient ID: Anne MorinGeorgina C Kulak MRN: [id number] DOB/AGE: [birth year] 71 y.o.  Admit date: 07/03/2013 Discharge date: 07/05/2013   Procedures:  Procedure(s) (LRB): LEFT TOTAL HIP ARTHROPLASTY ANTERIOR APPROACH (Left)  Attending Physician:  Dr. Durene RomansMatthew Olin   Admission Diagnoses:   Left hip OA / pain   Discharge Diagnoses:  Principal Problem:   S/P left THA, AA Active Problems:   Expected blood loss anemia   Overweight (BMI 25.0-29.9)  Past Medical History  Diagnosis Date  . Strep throat 06-26-2013 on amoxicillian  . Depression   . Anxiety   . Arthritis     oa  . Post-nasal drip     HPI: Anne [name], 73 y.o. female, has a history of pain and functional disability in the left hip(s) due to arthritis and patient has failed non-surgical conservative treatments for greater than 12 weeks to include NSAID's and/or analgesics, corticosteriod injections and activity modification. Onset of symptoms was gradual starting 3+ years ago with gradually worsening course since that time.The patient noted no past surgery on the left hip(s). Patient currently rates pain in the left hip at 8 out of 10 with activity. Patient has worsening of pain with activity and weight bearing, trendelenberg gait, pain that interfers with activities of daily living and pain with passive range of motion. Patient has evidence of periarticular osteophytes and joint space narrowing by imaging studies. This condition presents safety issues increasing the risk of falls. There is no current active infection. Risks, benefits and expectations were discussed with the patient. Risks including but not limited to the risk of anesthesia, blood clots, nerve damage, blood vessel damage, failure of the prosthesis, infection and up to and including death. Patient understand the risks, benefits and expectations and wishes to proceed with surgery.  PCP: [name],WENDY, MD   Discharged Condition:  good  Hospital Course:  Patient underwent the above stated procedure on 07/03/2013. Patient tolerated the procedure well and brought to the recovery room in good condition and subsequently to the floor.  POD #1 BP: 110/70 ; Pulse: 67 ; Temp: 97.9 F (36.6 C) ; Resp: 17  Patient reports pain as mild, pain controlled. No events throughout the night. Neurovascular intact, dorsiflexion/plantar flexion intact, incision: dressing C/D/I, no cellulitis present and compartment soft.  LABS  Basename    HGB  9.7  HCT  28.8   POD #2  BP: 100/58 ; Pulse: 69 ; Temp: 98.9 F (37.2 C) ; Resp: 16  Patient reports pain as mild, pain controlled. No events throughout the night. States that she was able to sleep last night and feels better today. Ready to be discharged to her friend's home. Neurovascular intact, dorsiflexion/plantar flexion intact, incision: dressing C/D/I, no cellulitis present and compartment soft.   LABS  Basename    HGB  9.1  HCT  28.4    Discharge Exam: General appearance: alert, cooperative and no distress Extremities: Homans sign is negative, no sign of DVT, no edema, redness or tenderness in the calves or thighs and no ulcers, gangrene or trophic changes  Disposition:     Home or Self Care with follow up in 2 weeks   Follow-up Information   Follow up with Shelda PalLIN,Artelia Game D, MD. Schedule an appointment as soon as possible for a visit in 2 weeks.   Specialty:  Orthopedic Surgery   Contact information:   [address] [place] [id number] [phone]       Discharge Orders   Future  Orders Complete By Expires   Call MD / Call 911  As directed    Comments:     If you experience chest pain or shortness of breath, CALL 911 and be transported to the hospital emergency room.  If you develope a fever above 101 F, pus (white drainage) or increased drainage or redness at the wound, or calf pain, call your surgeon's office.   Change dressing  As directed     Comments:     Maintain surgical dressing for 10-14 days, or until follow up in the clinic.   Constipation Prevention  As directed    Comments:     Drink plenty of fluids.  Prune juice may be helpful.  You may use a stool softener, such as Colace (over the counter) 100 mg twice a day.  Use MiraLax (over the counter) for constipation as needed.   Diet - low sodium heart healthy  As directed    Discharge instructions  As directed    Comments:     Maintain surgical dressing for 10-14 days, or until follow up in the clinic. Follow up in 2 weeks at [facility]. Call with any questions or concerns.   Increase activity slowly as tolerated  As directed    TED hose  As directed    Comments:     Use stockings (TED hose) for 2 weeks on both leg(s).  You may remove them at night for sleeping.   Weight bearing as tolerated  As directed    Questions:     Laterality:     Extremity:          Medication List    STOP taking these medications       amoxicillin 875 MG tablet  Commonly known as:  AMOXIL     IBUPROFEN COLD/SINUS 30-200 MG Tabs  Generic drug:  Pseudoephedrine-Ibuprofen      TAKE these medications       aspirin 325 MG EC tablet  Take 1 tablet (325 mg total) by mouth 2 (two) times daily.     DSS 100 MG Caps  Take 100 mg by mouth 2 (two) times daily.     ferrous sulfate 325 (65 FE) MG tablet  Take 1 tablet (325 mg total) by mouth 3 (three) times daily after meals.     guaiFENesin-dextromethorphan 100-10 MG/5ML syrup  Commonly known as:  ROBITUSSIN DM  Take 5 mLs by mouth every 4 (four) hours as needed for cough.     HYDROcodone-acetaminophen 7.5-325 MG per tablet  Commonly known as:  NORCO  Take 1-2 tablets by mouth every 4 (four) hours.     hyoscyamine 0.125 MG SL tablet  Commonly known as:  LEVSIN SL  Place 0.125 mg under the tongue every 4 (four) hours as needed for cramping.     multivitamin with minerals Tabs tablet  Take 1 tablet by mouth daily.      oxybutynin 10 MG 24 hr tablet  Commonly known as:  DITROPAN-XL  Take 10 mg by mouth at bedtime.     polyethylene glycol packet  Commonly known as:  MIRALAX / GLYCOLAX  Take 17 g by mouth daily as needed for mild constipation.     sertraline 100 MG tablet  Commonly known as:  ZOLOFT  Take 150 mg by mouth every morning.     tiZANidine 4 MG capsule  Commonly known as:  ZANAFLEX  Take 1 capsule (4 mg total) by mouth 3 (three) times daily as needed  for muscle spasms.     vitamin C 500 MG tablet  Commonly known as:  ASCORBIC ACID  Take 500 mg by mouth daily.     Vitamin D-3 5000 UNITS Tabs  Take 1 tablet by mouth daily.     vitamin E 400 UNIT capsule  Take 400 Units by mouth daily.         Signed: Anastasio Auerbach. Melia Hopes   PAC  07/06/2013, 8:58 AM

## 2013-08-23 ENCOUNTER — Other Ambulatory Visit: Payer: Self-pay

## 2013-08-23 DIAGNOSIS — Z1231 Encounter for screening mammogram for malignant neoplasm of breast: Secondary | ICD-10-CM

## 2013-09-06 ENCOUNTER — Ambulatory Visit
Admission: RE | Admit: 2013-09-06 | Discharge: 2013-09-06 | Disposition: A | Discharge status: Home / Self Care | Payer: Medicare Other | Source: Ambulatory Visit

## 2013-09-06 DIAGNOSIS — Z1231 Encounter for screening mammogram for malignant neoplasm of breast: Secondary | ICD-10-CM

## 2014-07-17 ENCOUNTER — Ambulatory Visit: Payer: Medicare Other | Attending: Family Medicine

## 2014-07-17 DIAGNOSIS — R269 Unspecified abnormalities of gait and mobility: Secondary | ICD-10-CM | POA: Diagnosis not present

## 2014-07-17 DIAGNOSIS — R29898 Other symptoms and signs involving the musculoskeletal system: Secondary | ICD-10-CM

## 2014-07-17 NOTE — Patient Instructions (Signed)
EXTENSION: Standing (Active)   Stand, both feet flat. Draw right leg behind body as far as possible. Use ___ lbs. Complete __2_ sets of _10__ repetitions. Perform 1-2___ sessions per day.  http://gtsc.exer.us/77   Copyright  VHI. All rights reserved.  ABDUCTION: Standing (Power)   Stand, feet flat. Lift right leg out to side as quickly as possible. Use ___ lbs. Complete _2__ sets of __10_ repetitions. Perform __1-2_ sessions per day.  Copyright  VHI. All rights reserved.   Stand on 1 leg: 10 seconds on each side.  Perform 3 reps each.  Use hand support on counter as needed.  Perform 2x/day  Heel Raises   Stand with support. Tighten pelvic floor and hold. With knees straight, raise heels off ground. Hold __2_ seconds. Relax for ___ seconds. Repeat _2x10__ times. Do _1-2__ times a day.  Copyright  VHI. All rights reserved.

## 2014-07-17 NOTE — Therapy (Signed)
[facility] [address], [address] [place], [place], [address] Phone: [phone]   Fax:  [phone]  Physical Therapy Evaluation  Patient Details  Name: Anne [name] MRN: [id number] Date of Birth: [birth year] Referring Provider:  Clayborn Heron, MD  Encounter Date: 07/17/2014      PT End of Session - 07/17/14 1030    Visit Number 1   Number of Visits --  10-Medicare   Date for PT Re-Evaluation 09/11/14   PT Start Time 0936   PT Stop Time 1025   PT Time Calculation (min) 49 min   Activity Tolerance Patient tolerated treatment well   Behavior During Therapy [facility] for tasks assessed/performed      Past Medical History  Diagnosis Date  . Strep throat 06-26-2013 on amoxicillian  . Depression   . Anxiety   . Arthritis     oa  . Post-nasal drip     Past Surgical History  Procedure Laterality Date  . Abdominal hysterectomy  1988    and bladder tach done  . Eye surgery Bilateral 2013    cataracts removed both eyes  . Pinkie finger surgery Right yrs ago  . 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  Visit Diagnosis:  Bilateral leg weakness - Plan: PT plan of care cert/re-cert  Abnormality of gait - Plan: PT plan of care cert/re-cert      Subjective Assessment - 07/17/14 0948    Symptoms Pt reports to PT with complatins of balance deficits over many years.  Pt had THA 06/2013 and reports that falls began around that time.     Pertinent History s/p Lt THA 06/2013   Limitations --   Patient Stated Goals Pt would like to improve gait to improved pattern and normalized step length.  Reduce falls risk   Currently in Pain? No/denies          [facility] PT Assessment - 07/17/14 0001    Assessment   Medical Diagnosis frequent  falls (R29.6)   Onset Date 06/24/12   Next MD Visit none scheduled   Precautions   Precautions Fall   Balance Screen   Has the patient fallen in the past 6 months Yes   How many times? 4  pt describes as stumbling   Has the patient had a decrease in activity level because of a fear of falling?  Yes   Is the patient reluctant to leave their home because of a fear of falling?  No   Home Environment   Living Enviornment Private residence   Home Access Stairs to enter   Home Layout Two level   Prior Function   Level of Independence Independent with basic ADLs   Vocation Retired   Leisure Pt exercises 3 days a week at [employer] and 3 days/wk at [employer] for [employer]   Overall Cognitive Status Within Functional Limits for tasks assessed   Posture/Postural Control   Posture/Postural Control No significant limitations   ROM / Strength   AROM / PROM / Strength Strength   Strength   Overall Strength Deficits   Strength Assessment Site Hip;Knee;Ankle   Right/Left Hip Right;Left   Right Hip Flexion 4+/5   Left Hip Flexion 4+/5   Right/Left Knee Right;Left   Right  Knee Flexion 4+/5   Right Knee Extension 4+/5   Left Knee Flexion 4+/5   Left Knee Extension 4+/5   Right/Left Ankle Right;Left   Right Ankle Dorsiflexion 4/5   Left Ankle Dorsiflexion 4/5   Palpation   Palpation NA   Ambulation/Gait   Ambulation/Gait Yes   Ambulation Distance (Feet) 100 Feet   Assistive device None   Gait Pattern Decreased dorsiflexion - right;Decreased dorsiflexion - left;Step-through pattern;Decreased step length - right;Decreased step length - left   Ambulation Surface Level   Stairs Yes   Stairs Assistance 7: Independent   Stair Management Technique One rail Right;Alternating pattern   Number of Stairs 4   Height of Stairs 6   Balance   Balance Assessed Yes   Standardized Balance Assessment   Standardized Balance Assessment Berg Balance Test   Berg Balance Test   Sit to Stand Able  to stand without using hands and stabilize independently   Standing Unsupported Able to stand safely 2 minutes   Sitting with Back Unsupported but Feet Supported on Floor or Stool Able to sit safely and securely 2 minutes   Stand to Sit Sits safely with minimal use of hands   Transfers Able to transfer safely, minor use of hands   Standing Unsupported with Eyes Closed Able to stand 10 seconds with supervision   Standing Ubsupported with Feet Together Able to place feet together independently and stand 1 minute safely   From Standing, Reach Forward with Outstretched Arm Can reach confidently >25 cm (10")   From Standing Position, Pick up Object from Floor Able to pick up shoe safely and easily   From Standing Position, Turn to Look Behind Over each Shoulder Looks behind one side only/other side shows less weight shift   Turn 360 Degrees Able to turn 360 degrees safely in 4 seconds or less   Standing Unsupported, Alternately Place Feet on Step/Stool Able to stand independently and safely and complete 8 steps in 20 seconds   Standing Unsupported, One Foot in Front Able to plae foot ahead of the other independently and hold 30 seconds   Standing on One Leg Able to lift leg independently and hold equal to or more than 3 seconds   Total Score 51   Functional Gait  Assessment   Gait assessed  No                  [facility] Adult PT Treatment/Exercise - 07/17/14 0001    Exercises   Exercises Knee/Hip;Ankle   Knee/Hip Exercises: Standing   Other Standing Knee Exercises standing hip abduction and extension 2x10   verbal cues for minimal UE support   Ankle Exercises: Standing   SLS 3x10 seconds bilaterally   Heel Raises 20 reps                PT Education - 07/17/14 1028    Education provided Yes   Education Details standing balance exercises   Person(s) Educated Patient   Methods Explanation;Demonstration;Tactile cues;Verbal cues;Handout   Comprehension Verbalized  understanding;Returned demonstration          PT Short Term Goals - 07/17/14 1036    PT SHORT TERM GOAL #1   Title be independent in initial HEP   Time 4   Period Weeks   Status New   PT SHORT TERM GOAL #2   Title report 50% improved confidence with gait with change of direction or position   Time 4   Period Weeks   Status  New           PT Long Term Goals - 07-19-14 1037    PT LONG TERM GOAL #1   Title verbalize and demonstrate understanding of fall prevention   Time 8   Period Weeks   Status New   PT LONG TERM GOAL #2   Title be independent in final HEP for balance activities   Time 8   Period Weeks   Status New   PT LONG TERM GOAL #3   Title improve Berg Balance score to > or = to 54/56 to reduce falls risk   Time 8   Period Weeks   Status New   PT LONG TERM GOAL #4   Title report 75% increased confidence with balance/gait with change of direction or position   Time 8   Period Weeks   Status New   PT LONG TERM GOAL #5   Title demonstrate improved heel strike and improved step length with gait on level surface   Time 8   Period Weeks   Status New               Plan - 19-Jul-2014 1033    Clinical Impression Statement Pt presents with balance deficits with stumbling gait at times.  Pt would benefit from high level balance training to reduce falls risk and improve stabilty.     Pt will benefit from skilled therapeutic intervention in order to improve on the following deficits Abnormal gait;Decreased strength;Decreased balance   Rehab Potential Good   PT Frequency 2x / week   PT Duration 8 weeks   PT Treatment/Interventions ADLs/Self Care Home Management;Patient/family education;Therapeutic activities;Therapeutic exercise;Gait training;Balance training;Neuromuscular re-education   PT Next Visit Plan Standing balance activities, ankle strength and proprioception   Consulted and Agree with Plan of Care Patient          G-Codes - 07/19/14 1041     Functional Assessment Tool Used Sharlene Motts and clinical   Functional Limitation Mobility: Walking and moving around   Mobility: Walking and Moving Around Current Status [phone]) At least 1 percent but less than 20 percent impaired, limited or restricted   Mobility: Walking and Moving Around Goal Status [phone]) At least 1 percent but less than 20 percent impaired, limited or restricted       Problem List Patient Active Problem List   Diagnosis Date Noted  . Expected blood loss anemia 07/04/2013  . Overweight (BMI 25.0-29.9) 07/04/2013  . S/P left THA, 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    Standley Bargo, PT July 19, 2014, 10:51 AM  [facility] [address], [address] [place], [place], [address] Phone: [phone]   Fax:  [phone]

## 2014-07-26 ENCOUNTER — Ambulatory Visit: Payer: Medicare Other | Attending: Family Medicine | Admitting: Physical Therapy

## 2014-07-26 ENCOUNTER — Encounter: Payer: Self-pay | Admitting: Physical Therapy

## 2014-07-26 DIAGNOSIS — R269 Unspecified abnormalities of gait and mobility: Secondary | ICD-10-CM | POA: Insufficient documentation

## 2014-07-26 DIAGNOSIS — R29898 Other symptoms and signs involving the musculoskeletal system: Secondary | ICD-10-CM | POA: Diagnosis present

## 2014-07-26 NOTE — Patient Instructions (Signed)
Tandem Stance   Right foot in front of left, heel touching toe both feet "straight ahead". Stand on Foot Triangle of Support with both feet. Balance in this position __5 to 10_ seconds. Do with left foot in front of right. Make sure to be able to hold on in case you loose your balane.  Copyright  VHI. All rights reserved.

## 2014-07-26 NOTE — Therapy (Signed)
[facility] [address] [place], [place], [address] Phone: [phone]   Fax:  [phone]  Physical Therapy Treatment  Patient Details  Name: Anne [name] MRN: [id number] Date of Birth: [birth year] Referring Provider:  Gweneth Dimitri, MD  Encounter Date: 07/26/2014      PT End of Session - 07/26/14 1206    Activity Tolerance Patient tolerated treatment well   Behavior During Therapy [facility] for tasks assessed/performed      Past Medical History  Diagnosis Date  . Strep throat 06-26-2013 on amoxicillian  . Depression   . Anxiety   . Arthritis     oa  . Post-nasal drip     Past Surgical History  Procedure Laterality Date  . Abdominal hysterectomy  1988    and bladder tach done  . Eye surgery Bilateral 2013    cataracts removed both eyes  . Pinkie finger surgery Right yrs ago  . 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  Visit Diagnosis:  Bilateral leg weakness  Abnormality of gait      Subjective Assessment - 07/26/14 1113    Symptoms Pt. reports balance deficits, and it is difficult  to walk in a straight linehad THA 06/2013   Pertinent History s/p Lt THA 06/2013   Patient Stated Goals Pt would like to improve gait to improved pattern and normalized step length.  Reduce falls risk   Currently in Pain? No/denies   Multiple Pain Sites No                    [facility] Adult PT Treatment/Exercise - 07/26/14 0001    Exercises   Exercises Knee/Hip   Knee/Hip Exercises: Aerobic   Stationary Bike L 2 x 8 min   Knee/Hip Exercises: Standing   Other Standing Knee Exercises standing hip abduction and extension 2x10   4#, verbal cues for minimal UE support,    Other Standing Knee Exercises Tandemstance up to 8  sec   Knee/Hip Exercises: Machines for Strengthening   Cybex Leg Press St 7, 105# 3x10 B LE   Ankle Exercises: Standing   SLS 5 x each side  practiced several times longest 8secRt, 4sec Lt    Heel Raises 20 reps                PT Education - 07/26/14 1204    Education provided Yes   Education Details Tandemstance   Person(s) Educated Patient   Methods Explanation;Demonstration;Handout   Comprehension Returned demonstration;Verbalized understanding          PT Short Term Goals - 07/26/14 1116    PT SHORT TERM GOAL #1   Title be independent in initial HEP   Period Weeks   Status Achieved           PT Long Term Goals - 07/17/14 1037    PT LONG TERM GOAL #1   Title verbalize and demonstrate understanding of fall prevention   Time 8   Period Weeks   Status New   PT LONG TERM GOAL #2   Title be independent in final HEP for balance activities   Time 8   Period Weeks   Status New   PT LONG TERM GOAL #3   Title improve Berg Balance score  to > or = to 54/56 to reduce falls risk   Time 8   Period Weeks   Status New   PT LONG TERM GOAL #4   Title report 75% increased confidence with balance/gait with change of direction or position   Time 8   Period Weeks   Status New   PT LONG TERM GOAL #5   Title demonstrate improved heel strike and improved step length with gait on level surface   Time 8   Period Weeks   Status New               Plan - 07/26/14 1207    Clinical Impression Statement Patient with balance deficits with stumbling gait at times. Pt will benefit from high level balance and strength B LE with focus on Lt   Pt will benefit from skilled therapeutic intervention in order to improve on the following deficits Abnormal gait;Decreased strength;Decreased balance   Rehab Potential Good   PT Frequency 2x / week   PT Duration 8 weeks   PT Treatment/Interventions ADLs/Self Care Home Management;Patient/family education;Therapeutic  activities;Therapeutic exercise;Gait training;Balance training;Neuromuscular re-education   PT Next Visit Plan Standing balance activities, ankle strength and proprioception   Consulted and Agree with Plan of Care Patient        Problem List Patient Active Problem List   Diagnosis Date Noted  . Expected blood loss anemia 07/04/2013  . Overweight (BMI 25.0-29.9) 07/04/2013  . S/P left THA, 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    NAUMANN-HOUEGNIFIO,Lavina Resor PTA  07/26/2014, 12:10 PM  [facility] [address], [address] [place], [place], [address] Phone: [phone]   Fax:  [phone]

## 2014-07-30 ENCOUNTER — Ambulatory Visit: Payer: Medicare Other

## 2014-07-30 DIAGNOSIS — R29898 Other symptoms and signs involving the musculoskeletal system: Secondary | ICD-10-CM

## 2014-07-30 DIAGNOSIS — R269 Unspecified abnormalities of gait and mobility: Secondary | ICD-10-CM

## 2014-07-30 NOTE — Therapy (Signed)
[facility] [address] [place], [place], [address] Phone: [phone]   Fax:  [phone]  Physical Therapy Treatment  Patient Details  Name: Anne [name] MRN: [id number] Date of Birth: 1941-05-13 Referring Provider:  Clayborn Heron, MD  Encounter Date: 07/30/2014      PT End of Session - 07/30/14 0944    Visit Number 3   Number of Visits 10  Medicare   Date for PT Re-Evaluation 09/11/14   PT Start Time 0851   PT Stop Time 0945   PT Time Calculation (min) 54 min   Activity Tolerance Patient tolerated treatment well   Behavior During Therapy [facility] for tasks assessed/performed      Past Medical History  Diagnosis Date  . Strep throat 06-26-2013 on amoxicillian  . Depression   . Anxiety   . Arthritis     oa  . Post-nasal drip     Past Surgical History  Procedure Laterality Date  . Abdominal hysterectomy  1988    and bladder tach done  . Eye surgery Bilateral 2013    cataracts removed both eyes  . Pinkie finger surgery Right yrs ago  . 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  Visit Diagnosis:  Bilateral leg weakness  Abnormality of gait      Subjective Assessment - 07/30/14 0854    Symptoms Pt has increased her activity at the gym.  Pt would like to use ankle weights at home with HEP.   Currently in Pain? No/denies          [facility] PT Assessment - 07/30/14 0001    Assessment   Medical Diagnosis frequent falls (R29.6)   Onset Date 06/24/12                  [facility] Adult PT Treatment/Exercise - 07/30/14 0001    Knee/Hip Exercises: Aerobic   Stationary Bike Level 3x 8 min  PT present to discuss progress toward goals.    Knee/Hip Exercises: Machines for Strengthening   Cybex Leg Press  Seat 7, 105# 3x10 B LE, single leg 65# 2x10 bil.   Knee/Hip Exercises: Standing   Rebounder mini tramp: weight shifting 3 ways x 1 min each   Walking with Sports Cord 25# forward x10    Other Standing Knee Exercises standing hip abduction and extension 2x10   4#   Other Standing Knee Exercises Tandem stance 3x10 seconds Rt and Lt each                  PT Short Term Goals - 07/30/14 0900    PT SHORT TERM GOAL #1   Title be independent in initial HEP   Time 4   Status Achieved  Pt is independent with initial HEP and gym exercises   PT SHORT TERM GOAL #2   Title report 50% improved confidence with gait with change of direction or position   Time 4   Status On-going  No significant change since starting new exercises           PT Long Term Goals - 07/17/14 1037    PT LONG TERM GOAL #1   Title verbalize and demonstrate understanding of fall prevention   Time 8   Period Weeks   Status  New   PT LONG TERM GOAL #2   Title be independent in final HEP for balance activities   Time 8   Period Weeks   Status New   PT LONG TERM GOAL #3   Title improve Berg Balance score to > or = to 54/56 to reduce falls risk   Time 8   Period Weeks   Status New   PT LONG TERM GOAL #4   Title report 75% increased confidence with balance/gait with change of direction or position   Time 8   Period Weeks   Status New   PT LONG TERM GOAL #5   Title demonstrate improved heel strike and improved step length with gait on level surface   Time 8   Period Weeks   Status New               Plan - 07/30/14 0901    Clinical Impression Statement Pt is independent in new HEP and has added new exercises at the gym.  See goals for assessment.  Pt is tolerating gym exercises for strength and balance well.     Pt will benefit from skilled therapeutic intervention in order to improve on the following deficits Abnormal gait;Decreased strength;Decreased balance   Rehab Potential Good   PT  Frequency 2x / week   PT Duration 8 weeks   PT Treatment/Interventions ADLs/Self Care Home Management;Patient/family education;Therapeutic activities;Therapeutic exercise;Gait training;Balance training;Neuromuscular re-education   PT Next Visit Plan Standing balance activities, ankle strength and proprioception   PT Home Exercise Plan Add more balance exercise to HEP, wall slides for strength   Consulted and Agree with Plan of Care Patient        Problem List Patient Active Problem List   Diagnosis Date Noted  . Expected blood loss anemia 07/04/2013  . Overweight (BMI 25.0-29.9) 07/04/2013  . S/P left THA, 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    TAKACS,KELLY, PT 07/30/2014, 9:46 AM  [facility] [address], [address] [place], [place], [address] Phone: [phone]   Fax:  [phone]

## 2014-08-01 ENCOUNTER — Encounter: Payer: Self-pay | Admitting: Physical Therapy

## 2014-08-01 ENCOUNTER — Ambulatory Visit: Payer: Medicare Other | Admitting: Physical Therapy

## 2014-08-01 DIAGNOSIS — R269 Unspecified abnormalities of gait and mobility: Secondary | ICD-10-CM

## 2014-08-01 DIAGNOSIS — R29898 Other symptoms and signs involving the musculoskeletal system: Secondary | ICD-10-CM | POA: Diagnosis not present

## 2014-08-01 NOTE — Patient Instructions (Signed)
Abduction: Clam (Eccentric) - Side-Lying   Lie on side with knees bent. Lift top knee, keeping feet together. Keep trunk steady. Slowly lower 3 x 10 reps per set, _2-3__ sets per day, _5_ days per week. Add _1#__ lbs when you achieve _40.  Copyright  VHI. All rights reserved.

## 2014-08-01 NOTE — Therapy (Signed)
[facility] [address] [place], [place], [address] Phone: [phone]   Fax:  [phone]  Physical Therapy Treatment  Patient Details  Name: Anne [name] MRN: [id number] Date of Birth: 02-20-1941 Referring Provider:  Clayborn Heron, MD  Encounter Date: 08/01/2014      PT End of Session - 08/01/14 0938    Visit Number 4   Number of Visits 10   Date for PT Re-Evaluation 09/11/14   PT Start Time 0848   PT Stop Time 0934   PT Time Calculation (min) 46 min   Activity Tolerance Patient tolerated treatment well   Behavior During Therapy [facility] for tasks assessed/performed      Past Medical History  Diagnosis Date  . Strep throat 06-26-2013 on amoxicillian  . Depression   . Anxiety   . Arthritis     oa  . Post-nasal drip     Past Surgical History  Procedure Laterality Date  . Abdominal hysterectomy  1988    and bladder tach done  . Eye surgery Bilateral 2013    cataracts removed both eyes  . Pinkie finger surgery Right yrs ago  . 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  Visit Diagnosis:  Bilateral leg weakness  Abnormality of gait      Subjective Assessment - 08/01/14 0900    Symptoms Pt reports feels more confident due to improved strengt B LE   Pertinent History s/p Lt THA 06/2013   Patient Stated Goals Pt would like to improve gait to improved pattern and normalized step length.  Reduce falls risk   Currently in Pain? No/denies   Multiple Pain Sites No                    [facility] Adult PT Treatment/Exercise - 08/01/14 0001    Knee/Hip Exercises: Aerobic   Stationary Bike Level 4x 5 min, L5 x  Pt feels lightheaded after bike, sitting & water intake     Knee/Hip Exercises: Machines  for Strengthening   Cybex Leg Press Seat 7, 105# 3x10 B LE, single leg 65# 2x10 bil.   Knee/Hip Exercises: Standing   Rebounder mini tramp: weight shifting 3 ways x 1 min each   Other Standing Knee Exercises Tandem stance 3x10 seconds Rt and Lt each  challening   Knee/Hip Exercises: Sidelying   Hip ABduction AROM;3 sets;Left  with Lt LE in Rt sidelying     Clams 3 x 10 with Lt leg in Rt sidelying                PT Education - 08/01/14 0930    Education provided Yes   Education Details Clams in sidelying   Person(s) Educated Patient   Methods Explanation;Demonstration;Handout   Comprehension Verbalized understanding;Returned demonstration          PT Short Term Goals - 07/30/14 0900    PT SHORT TERM GOAL #1   Title be independent in initial HEP   Time 4   Status Achieved  Pt is independent with initial HEP and gym exercises   PT SHORT TERM GOAL #2   Title report 50% improved confidence with gait with change of direction or position   Time 4   Status On-going  No significant  change since starting new exercises           PT Long Term Goals - 07/17/14 1037    PT LONG TERM GOAL #1   Title verbalize and demonstrate understanding of fall prevention   Time 8   Period Weeks   Status New   PT LONG TERM GOAL #2   Title be independent in final HEP for balance activities   Time 8   Period Weeks   Status New   PT LONG TERM GOAL #3   Title improve Berg Balance score to > or = to 54/56 to reduce falls risk   Time 8   Period Weeks   Status New   PT LONG TERM GOAL #4   Title report 75% increased confidence with balance/gait with change of direction or position   Time 8   Period Weeks   Status New   PT LONG TERM GOAL #5   Title demonstrate improved heel strike and improved step length with gait on level surface   Time 8   Period Weeks   Status New               Plan - 08/01/14 0940    Clinical Impression Statement Pt with Lt LE muscle weakness    Pt  will benefit from skilled therapeutic intervention in order to improve on the following deficits Abnormal gait;Decreased strength;Decreased balance   Rehab Potential Good   PT Frequency 2x / week   PT Duration 8 weeks   PT Treatment/Interventions ADLs/Self Care Home Management;Patient/family education;Therapeutic activities;Therapeutic exercise;Gait training;Balance training;Neuromuscular re-education   PT Next Visit Plan Standing balance activities, ankle strength and proprioception   PT Home Exercise Plan Add more balance exercise to HEP, wall slides for strength        Problem List Patient Active Problem List   Diagnosis Date Noted  . Expected blood loss anemia 07/04/2013  . Overweight (BMI 25.0-29.9) 07/04/2013  . S/P left THA, 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    NAUMANN-HOUEGNIFIO,Zamier Eggebrecht PTA 08/01/2014, 9:44 AM  [facility] [address], [address] [place], [place], [address] Phone: [phone]   Fax:  [phone]

## 2014-08-06 ENCOUNTER — Ambulatory Visit: Payer: Medicare Other

## 2014-08-06 DIAGNOSIS — R29898 Other symptoms and signs involving the musculoskeletal system: Secondary | ICD-10-CM

## 2014-08-06 DIAGNOSIS — R269 Unspecified abnormalities of gait and mobility: Secondary | ICD-10-CM

## 2014-08-06 NOTE — Therapy (Signed)
[facility] [address], [address] [place], [place], [address] Phone: [phone]   Fax:  [phone]  Physical Therapy Treatment  Patient Details  Name: Anne [name] MRN: [id number] Date of Birth: 11-04-1940 Referring Provider:  Gweneth Dimitri, MD  Encounter Date: 08/06/2014      PT End of Session - 08/06/14 1441    Visit Number 5   Number of Visits 10  Medicare   Date for PT Re-Evaluation 09/11/14   PT Start Time 1401   PT Stop Time 1443   PT Time Calculation (min) 42 min   Activity Tolerance Patient tolerated treatment well   Behavior During Therapy [facility] for tasks assessed/performed      Past Medical History  Diagnosis Date  . Strep throat 06-26-2013 on amoxicillian  . Depression   . Anxiety   . Arthritis     oa  . Post-nasal drip     Past Surgical History  Procedure Laterality Date  . Abdominal hysterectomy  1988    and bladder tach done  . Eye surgery Bilateral 2013    cataracts removed both eyes  . Pinkie finger surgery Right yrs ago  . 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  Visit Diagnosis:  Bilateral leg weakness  Abnormality of gait      Subjective Assessment - 08/06/14 1406    Symptoms Pt reports inconsistency with ability to balance on one leg at home with HEP.   Currently in Pain? No/denies                       [facility] Adult PT Treatment/Exercise - 08/06/14 0001    Knee/Hip Exercises: Aerobic   Stationary Bike Level 4x 5 min, L5 x   Knee/Hip Exercises: Machines for Strengthening   Cybex Leg Press Seat 7, 105# 3x10 B LE, single leg 65# 3x10 bil.  pt tolerated increased reps well today   Knee/Hip Exercises: Standing   Rebounder mini tramp: weight shifting 3 ways x 1 min  each   Walking with Sports Cord 25# forward and reverse x10, 20# sidestepping x 10 each  SBA for safety                PT Education - 08/06/14 1410    Education provided Yes   Education Details fall prevention   Person(s) Educated Patient   Methods Explanation;Handout   Comprehension Verbalized understanding          PT Short Term Goals - 08/06/14 1407    PT SHORT TERM GOAL #1   Title be independent in initial HEP   Time 4   Period Weeks   Status Achieved   PT SHORT TERM GOAL #2   Title report 50% improved confidence with gait with change of direction or position   Time 4   Period Weeks   Status Achieved  Pt reports >50% improvement           PT Long Term Goals - 08/06/14 1408    PT LONG TERM GOAL #1   Title verbalize and demonstrate understanding of fall prevention   Time 8   Period Weeks   Status Achieved   PT LONG TERM GOAL #3   Title improve Berg Balance score to > or =  to 54/56 to reduce falls risk   Time 8   Period Weeks   Status On-going   PT LONG TERM GOAL #4   Title report 75% increased confidence with balance/gait with change of direction or position   Time 8   Period Weeks   Status On-going  50% improvement reported               Plan - 08/06/14 1433    Clinical Impression Statement Pt tolerated advancement of resisted walking today.  See goals for status this week.     Pt will benefit from skilled therapeutic intervention in order to improve on the following deficits Abnormal gait;Decreased strength;Decreased balance   Rehab Potential Good   PT Frequency 2x / week   PT Duration 8 weeks   PT Treatment/Interventions ADLs/Self Care Home Management;Patient/family education;Therapeutic activities;Therapeutic exercise;Gait training;Balance training;Neuromuscular re-education   PT Next Visit Plan Standing balance activities, ankle strength and proprioception   PT Home Exercise Plan Add more balance exercise to HEP, wall slides for  strength        Problem List Patient Active Problem List   Diagnosis Date Noted  . Expected blood loss anemia 07/04/2013  . Overweight (BMI 25.0-29.9) 07/04/2013  . 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, [address] [place], [place], [address] Phone: [phone]   Fax:  [phone]

## 2014-08-06 NOTE — Patient Instructions (Signed)

## 2014-08-08 ENCOUNTER — Ambulatory Visit: Payer: Medicare Other

## 2014-08-08 DIAGNOSIS — R29898 Other symptoms and signs involving the musculoskeletal system: Secondary | ICD-10-CM | POA: Diagnosis not present

## 2014-08-08 DIAGNOSIS — R269 Unspecified abnormalities of gait and mobility: Secondary | ICD-10-CM

## 2014-08-08 NOTE — Therapy (Signed)
[facility] [address], [address] [place], [place], [address] Phone: [phone]   Fax:  [phone]  Physical Therapy Treatment  Patient Details  Name: Anne [name] MRN: [id number] Date of Birth: 12-28-1940 Referring Provider:  [name], Wendy, MD  Encounter Date: 08/08/2014      PT End of Session - 08/08/14 0928    Visit Number 6   Number of Visits 10  Medicare   Date for PT Re-Evaluation 09/11/14   PT Start Time 0850   PT Stop Time 0934   PT Time Calculation (min) 44 min   Activity Tolerance Patient tolerated treatment well   Behavior [facility] for tasks assessed/performed      Past Medical History  Diagnosis Date  . Strep throat 06-26-2013 on amoxicillian  . Depression   . Anxiety   . Arthritis     oa  . Post-nasal drip     Past Surgical History  Procedure Laterality Date  . Abdominal hysterectomy  1988    and bladder tach done  . Eye surgery Bilateral 2013    cataracts removed both eyes  . Pinkie finger surgery Right yrs ago  . 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  Visit Diagnosis:  Bilateral leg weakness  Abnormality of gait      Subjective Assessment - 08/08/14 0904    Symptoms Pt reports that she is feeling well today.   Currently in Pain? No/denies                       [facility]/Exercise - 08/08/14 0001    Knee/Hip Exercises: Aerobic   Stationary Bike Level 5x8 minutes   Knee/Hip Exercises: Machines for Strengthening   Cybex Leg Press Seat 7, 105# 3x10 B LE, single leg 65# 3x10 bil.   Knee/Hip Exercises: Standing   Forward Step Up Both;2 sets;10 reps  Bosu   Rebounder mini tramp: weight shifting 3 ways x 1 min each  no UE support needed   Walking  with Sports Cord 25# forward and reverse x10, 20# sidestepping x 10 each  SBA for safety                  PT Short Term Goals - 08/06/14 1407    PT SHORT TERM GOAL #1   Title be independent in initial HEP   Time 4   Period Weeks   Status Achieved   PT SHORT TERM GOAL #2   Title report 50% improved confidence with gait with change of direction or position   Time 4   Period Weeks   Status Achieved  Pt reports >50% improvement           PT Long Term Goals - 08/06/14 1408    PT LONG TERM GOAL #1   Title verbalize and demonstrate understanding of fall prevention   Time 8   Period Weeks   Status Achieved   PT LONG TERM GOAL #3   Title improve Berg Balance score to > or = to 54/56 to reduce falls risk   Time 8   Period Weeks   Status On-going   PT LONG TERM GOAL #4   Title report 75% increased confidence with balance/gait with change of direction or  position   Time 8   Period Weeks   Status On-going  50% improvement reported               Plan - 08/08/14 0901    Clinical Impression Statement No pain with treatment today.  Pt tolerated advancement of exercise well today.   Pt will benefit from skilled therapeutic intervention in order to improve on the following deficits Abnormal gait;Decreased strength;Decreased balance   Rehab Potential Good   PT Frequency 2x / week   PT Duration 8 weeks   PT Treatment/Interventions ADLs/Self Care Home Management;Patient/family education;Therapeutic activities;Therapeutic exercise;Gait training;Balance training;Neuromuscular re-education   PT Next Visit Plan Standing balance activities, ankle strength and proprioception   Consulted and Agree with Plan of Care Patient        Problem List Patient Active Problem List   Diagnosis Date Noted  . Expected blood loss anemia 07/04/2013  . Overweight (BMI 25.0-29.9) 07/04/2013  . 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 [place], [place], [address] Phone: [phone]   Fax:  [phone]

## 2014-08-12 ENCOUNTER — Other Ambulatory Visit: Payer: Self-pay

## 2014-08-12 DIAGNOSIS — Z803 Family history of malignant neoplasm of breast: Secondary | ICD-10-CM

## 2014-08-12 DIAGNOSIS — Z1231 Encounter for screening mammogram for malignant neoplasm of breast: Secondary | ICD-10-CM

## 2014-08-13 ENCOUNTER — Ambulatory Visit: Payer: Medicare Other

## 2014-08-13 DIAGNOSIS — R29898 Other symptoms and signs involving the musculoskeletal system: Secondary | ICD-10-CM

## 2014-08-13 DIAGNOSIS — R269 Unspecified abnormalities of gait and mobility: Secondary | ICD-10-CM

## 2014-08-13 NOTE — Therapy (Signed)
[facility] [address], [address] [place], [place], [address] Phone: [phone]   Fax:  [phone]  Physical Therapy Treatment  Patient Details  Name: Anne MorinGeorgina C [name] MRN: [id number] Date of Birth: [birth year] Referring Provider:  [name], Wendy, MD  Encounter Date: 08/13/2014      PT End of Session - 08/13/14 1655    Visit Number 7   Number of Visits 10  Medicare   Date for PT Re-Evaluation 09/11/14   PT Start Time 1615   PT Stop Time 1655   PT Time Calculation (min) 40 min   Activity Tolerance Patient tolerated treatment well   Behavior During Therapy [facility] for tasks assessed/performed      Past Medical History  Diagnosis Date  . Strep throat 06-26-2013 on amoxicillian  . Depression   . Anxiety   . Arthritis     oa  . Post-nasal drip     Past Surgical History  Procedure Laterality Date  . Abdominal hysterectomy  1988    and bladder tach done  . Eye surgery Bilateral 2013    cataracts removed both eyes  . Pinkie finger surgery Right yrs ago  . 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  Visit Diagnosis:  Bilateral leg weakness  Abnormality of gait      Subjective Assessment - 08/13/14 1622    Symptoms Doing well with exercises.  Much more focused and aware of gait and being careful to improve balance.   Currently in Pain? No/denies   Multiple Pain Sites No                       [facility] Adult PT Treatment/Exercise - 08/13/14 0001    Knee/Hip Exercises: Aerobic   Stationary Bike Level 5x8 minutes   Knee/Hip Exercises: Machines for Strengthening   Cybex Leg Press Seat 7, 105# 3x10 B LE, single leg 65# 3x10 bil.   Knee/Hip Exercises: Standing   Rebounder mini tramp: weight shifting 3 ways x 1 min  each  no UE support needed   Walking with Sports Cord 25# forward and reverse x10, 20# sidestepping x 10 each  SBA for safety   Other Standing Knee Exercises Tandem stance 3x10 seconds Rt and Lt each  modified tandem-advised pt to perform modified at home                  PT Short Term Goals - 08/06/14 1407    PT SHORT TERM GOAL #1   Title be independent in initial HEP   Time 4   Period Weeks   Status Achieved   PT SHORT TERM GOAL #2   Title report 50% improved confidence with gait with change of direction or position   Time 4   Period Weeks   Status Achieved  Pt reports >50% improvement           PT Long Term Goals - 08/13/14 1624    PT LONG TERM GOAL #1   Title verbalize and demonstrate understanding of fall prevention   Status Achieved   PT LONG TERM GOAL #2   Title be independent in final HEP for balance activities   Time 8   Period Weeks   Status On-going  Pt is independent in current  HEP   PT LONG TERM GOAL #3   Title improve Berg Balance score to > or = to 54/56 to reduce falls risk   Status On-going  Not tested- will re-test in 2 weeks   PT LONG TERM GOAL #4   Title report 75% increased confidence with balance/gait with change of direction or position   Time 8   Period Weeks   Status On-going  60% improved confidence   PT LONG TERM GOAL #5   Title demonstrate improved heel strike and improved step length with gait on level surface   Time 8   Period Weeks   Status Achieved               Plan - 08/13/14 1648    Clinical Impression Statement Pt with 60% improved confidence with balance.  See goals for assessment.  Pt will attend 1 this week and take 2 weeks off to determine progress on own with HEP.   Pt will benefit from skilled therapeutic intervention in order to improve on the following deficits Abnormal gait;Decreased strength;Decreased balance   Rehab Potential Good   PT Frequency 2x / week   PT Duration 8 weeks   PT  Treatment/Interventions ADLs/Self Care Home Management;Patient/family education;Therapeutic activities;Therapeutic exercise;Gait training;Balance training;Neuromuscular re-education   PT Next Visit Plan Standing balance activities, ankle strength and proprioception        Problem List Patient Active Problem List   Diagnosis Date Noted  . Expected blood loss anemia 07/04/2013  . Overweight (BMI 25.0-29.9) 07/04/2013  . 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, [address] [place], [place], [address] Phone: [phone]   Fax:  [phone]

## 2014-08-15 ENCOUNTER — Ambulatory Visit: Payer: Medicare Other

## 2014-08-15 DIAGNOSIS — R29898 Other symptoms and signs involving the musculoskeletal system: Secondary | ICD-10-CM | POA: Diagnosis not present

## 2014-08-15 DIAGNOSIS — R269 Unspecified abnormalities of gait and mobility: Secondary | ICD-10-CM

## 2014-08-15 NOTE — Therapy (Signed)
[facility] [address], [address] [place], [place], [address] Phone: [phone]   Fax:  [phone]  Physical Therapy Treatment  Patient Details  Name: Anne MorinGeorgina C [name] MRN: [id number] Date of Birth: [birth year] Referring Provider:  [name], Wendy, MD  Encounter Date: 08/15/2014      PT End of Session - 08/15/14 0932    Visit Number 8   Number of Visits 10  Medicare   Date for PT Re-Evaluation 09/11/14   PT Start Time 0850   PT Stop Time 0930   PT Time Calculation (min) 40 min   Activity Tolerance Patient tolerated treatment well   Behavior During Therapy [facility] for tasks assessed/performed      Past Medical History  Diagnosis Date  . Strep throat 06-26-2013 on amoxicillian  . Depression   . Anxiety   . Arthritis     oa  . Post-nasal drip     Past Surgical History  Procedure Laterality Date  . Abdominal hysterectomy  1988    and bladder tach done  . Eye surgery Bilateral 2013    cataracts removed both eyes  . Pinkie finger surgery Right yrs ago  . 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  Visit Diagnosis:  Bilateral leg weakness  Abnormality of gait      Subjective Assessment - 08/15/14 0856    Symptoms Pt will do exercises independently until follow-up with PT in 2 weeks.     Currently in Pain? No/denies                       [facility]/Exercise - 08/15/14 0001    Knee/Hip Exercises: Aerobic   Stationary Bike Level 5x8 minutes   Knee/Hip Exercises: Machines for Strengthening   Cybex Leg Press Seat 7, 105# 3x10 B LE, single leg 65# 3x10 bil.   Knee/Hip Exercises: Standing   Rebounder mini tramp: weight shifting 3 ways x 1 min each  no UE support needed   Walking with Sports Cord  25# forward and reverse x10, 20# sidestepping x 10 each  SBA for safety                  PT Short Term Goals - 08/06/14 1407    PT SHORT TERM GOAL #1   Title be independent in initial HEP   Time 4   Period Weeks   Status Achieved   PT SHORT TERM GOAL #2   Title report 50% improved confidence with gait with change of direction or position   Time 4   Period Weeks   Status Achieved  Pt reports >50% improvement           PT Long Term Goals - 08/13/14 1624    PT LONG TERM GOAL #1   Title verbalize and demonstrate understanding of fall prevention   Status Achieved   PT LONG TERM GOAL #2   Title be independent in final HEP for balance activities   Time 8   Period Weeks   Status On-going  Pt is independent in current HEP   PT LONG TERM GOAL #3   Title improve Berg Balance score to > or = to 54/56 to reduce falls risk   Status On-going  Not tested- will  re-test in 2 weeks   PT LONG TERM GOAL #4   Title report 75% increased confidence with balance/gait with change of direction or position   Time 8   Period Weeks   Status On-going  60% improved confidence   PT LONG TERM GOAL #5   Title demonstrate improved heel strike and improved step length with gait on level surface   Time 8   Period Weeks   Status Achieved               Plan - 08/15/14 0902    Clinical Impression Statement Pt is doing well with balance and strength progression. Pt will follow-up with PT in 2 weeks to determine D/C vs continuation.   Pt will benefit from skilled therapeutic intervention in order to improve on the following deficits Abnormal gait;Decreased strength;Decreased balance   Rehab Potential Good   PT Frequency 2x / week   PT Duration 8 weeks   PT Treatment/Interventions ADLs/Self Care Home Management;Patient/family education;Therapeutic activities;Therapeutic exercise;Gait training;Balance training;Neuromuscular re-education   PT Next Visit Plan PT will reassess in 2 weeks.   Berg Balance Test.   Consulted and Agree with Plan of Care Patient        Problem List Patient Active Problem List   Diagnosis Date Noted  . Expected blood loss anemia 07/04/2013  . Overweight (BMI 25.0-29.9) 07/04/2013  . 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, [address] [place], [place], [address] Phone: [phone]   Fax:  [phone]

## 2014-09-03 ENCOUNTER — Ambulatory Visit: Payer: Medicare Other

## 2014-09-06 ENCOUNTER — Ambulatory Visit: Payer: Medicare Other | Attending: Family Medicine | Admitting: Physical Therapy

## 2014-09-06 ENCOUNTER — Encounter: Payer: Self-pay | Admitting: Physical Therapy

## 2014-09-06 DIAGNOSIS — R269 Unspecified abnormalities of gait and mobility: Secondary | ICD-10-CM

## 2014-09-06 DIAGNOSIS — R29898 Other symptoms and signs involving the musculoskeletal system: Secondary | ICD-10-CM | POA: Insufficient documentation

## 2014-09-06 NOTE — Therapy (Signed)
[facility] [address] [place], [place], [address] Phone: [phone]   Fax:  [phone]  Physical Therapy Treatment  Patient Details  Name: Anne [name] MRN: [id number] Date of Birth: 07-17-1940 Referring Provider:  Cari Caraway, MD  Encounter Date: 09/06/2014      PT End of Session - 09/06/14 0920    Visit Number 8   Number of Visits 10   Date for PT Re-Evaluation 09/11/14   PT Start Time 0852   PT Stop Time 0930   PT Time Calculation (min) 38 min   Activity Tolerance Patient tolerated treatment well   Behavior During Therapy [facility] for tasks assessed/performed      Past Medical History  Diagnosis Date  . Strep throat 06-26-2013 on amoxicillian  . Depression   . Anxiety   . Arthritis     oa  . Post-nasal drip     Past Surgical History  Procedure Laterality Date  . Abdominal hysterectomy  1988    and bladder tach done  . Eye surgery Bilateral 2013    cataracts removed both eyes  . Pinkie finger surgery Right yrs ago  . 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  Visit Diagnosis:  Bilateral leg weakness  Abnormality of gait      Subjective Assessment - 09/06/14 0855    Subjective Pt reports improved awarness with bodymechanics and improved gait patterns, since start of PT to reduce fall risk   Pertinent History s/p Lt THA 06/2013   Patient Stated Goals Pt would like to improve gait to improved pattern and normalized step length.  Reduce falls risk   Currently in Pain? No/denies            [facility] PT Assessment - 09/06/14 0001    Berg Balance Test   Sit to Stand Able to stand without using hands and stabilize independently   Standing Unsupported Able to stand safely 2 minutes   Sitting with Back  Unsupported but Feet Supported on Floor or Stool Able to sit safely and securely 2 minutes   Stand to Sit Sits safely with minimal use of hands   Transfers Able to transfer safely, minor use of hands   Standing Unsupported with Eyes Closed Able to stand 10 seconds safely   Standing Ubsupported with Feet Together Able to place feet together independently and stand 1 minute safely   From Standing, Reach Forward with Outstretched Arm Can reach confidently >25 cm (10")   From Standing Position, Pick up Object from Floor Able to pick up shoe safely and easily   From Standing Position, Turn to Look Behind Over each Shoulder Looks behind from both sides and weight shifts well   Turn 360 Degrees Able to turn 360 degrees safely in 4 seconds or less   Standing Unsupported, Alternately Place Feet on Step/Stool Able to stand independently and safely and complete 8 steps in 20 seconds   Standing Unsupported, One Foot in Front Able to plae foot ahead of the other independently and hold 30 seconds   Standing on One Leg Able to lift leg independently and hold 5-10 seconds   Total Score 54                   [facility] Adult  PT Treatment/Exercise - 21-Sep-2014 0001    Knee/Hip Exercises: Aerobic   Stationary Bike Level 5x8 minutes   Knee/Hip Exercises: Machines for Strengthening   Cybex Leg Press Seat 7, 105# 3x10 B LE, single leg 65# 3x10 bil.   Knee/Hip Exercises: Standing   Other Standing Knee Exercises BERG Balance test performed                  PT Short Term Goals - 08/06/14 1407    PT SHORT TERM GOAL #1   Title be independent in initial HEP   Time 4   Period Weeks   Status Achieved   PT SHORT TERM GOAL #2   Title report 50% improved confidence with gait with change of direction or position   Time 4   Period Weeks   Status Achieved  Pt reports >50% improvement           PT Long Term Goals - September 21, 2014 0859    PT LONG TERM GOAL #1   Title verbalize and demonstrate  understanding of fall prevention   Time 8   Period Weeks   Status Achieved   PT LONG TERM GOAL #2   Title be independent in final HEP for balance activities   Time 8   Period Weeks   Status Achieved   PT LONG TERM GOAL #3   Title improve Berg Balance score to > or = to 54/56 to reduce falls risk  September 21, 2014 pt scored 54 points on Berg   Time 8   Period Weeks   Status Achieved   PT LONG TERM GOAL #4   Title report 75% increased confidence with balance/gait with change of direction or position   Time 8   Period Weeks   Status Achieved   PT LONG TERM GOAL #5   Title demonstrate improved heel strike and improved step length with gait on level surface   Time 8   Period Weeks   Status Achieved               Plan - 21-Sep-2014 9379    Clinical Impression Statement Pt is ready for D/C from PT, she is going to the [facility] and participates in aquatic yoga classes and using the gym to continue with strength   Pt will benefit from skilled therapeutic intervention in order to improve on the following deficits Abnormal gait;Decreased strength;Decreased balance   Rehab Potential Good   PT Frequency 2x / week   PT Duration 8 weeks   PT Treatment/Interventions ADLs/Self Care Home Management;Patient/family education;Therapeutic activities;Therapeutic exercise;Gait training;Balance training;Neuromuscular re-education   PT Next Visit Plan Patient is ready for D/C she scored 54 points on BERG   Consulted and Agree with Plan of Care Patient          G-Codes - September 21, 2014 0240    Functional Assessment Tool Used Merrilee Jansky balance 54/56   Functional Limitation Mobility: Walking and moving around   Mobility: Walking and Moving Around Goal Status [phone]) At least 1 percent but less than 20 percent impaired, limited or restricted   Mobility: Walking and Moving Around Discharge Status [phone]) At least 1 percent but less than 20 percent impaired, limited or restricted      Problem List Patient Active  Problem List   Diagnosis Date Noted  . Expected blood loss anemia 07/04/2013  . Overweight (BMI 25.0-29.9) 07/04/2013  . 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, [place], [place], [address] Phone: [phone]   Fax:  [phone] PHYSICAL THERAPY DISCHARGE SUMMARY  Visits from Start of Care: 8  Current functional level related to goals / functional outcomes: Patient has met all of her LTG and STG as above.    Remaining deficits: None   Education / Equipment: HEP Plan: Patient agrees to discharge.  Patient goals were met. Patient is being discharged due to meeting the stated rehab goals. Thank you for the referral.  ?????  Earlie Counts, PT 09/06/2014 9:43 AM

## 2014-09-09 ENCOUNTER — Ambulatory Visit: Payer: Medicare Other

## 2014-09-17 ENCOUNTER — Ambulatory Visit
Admission: RE | Admit: 2014-09-17 | Discharge: 2014-09-17 | Disposition: A | Discharge status: Home / Self Care | Payer: Medicare Other | Source: Ambulatory Visit

## 2014-09-17 DIAGNOSIS — Z1231 Encounter for screening mammogram for malignant neoplasm of breast: Secondary | ICD-10-CM

## 2014-09-17 DIAGNOSIS — Z803 Family history of malignant neoplasm of breast: Secondary | ICD-10-CM

## 2015-08-15 ENCOUNTER — Ambulatory Visit (INDEPENDENT_AMBULATORY_CARE_PROVIDER_SITE_OTHER): Payer: Medicare Other

## 2015-08-15 ENCOUNTER — Ambulatory Visit (INDEPENDENT_AMBULATORY_CARE_PROVIDER_SITE_OTHER): Payer: Medicare Other | Admitting: Podiatry

## 2015-08-15 ENCOUNTER — Encounter: Payer: Self-pay | Admitting: Podiatry

## 2015-08-15 VITALS — BP 108/78 | HR 75 | Resp 16 | Ht 66.0 in | Wt 169.0 lb

## 2015-08-15 DIAGNOSIS — M79604 Pain in right leg: Secondary | ICD-10-CM | POA: Diagnosis not present

## 2015-08-15 DIAGNOSIS — M779 Enthesopathy, unspecified: Secondary | ICD-10-CM

## 2015-08-15 DIAGNOSIS — M79673 Pain in unspecified foot: Secondary | ICD-10-CM

## 2015-08-15 DIAGNOSIS — M79605 Pain in left leg: Secondary | ICD-10-CM | POA: Diagnosis not present

## 2015-08-15 DIAGNOSIS — B351 Tinea unguium: Secondary | ICD-10-CM

## 2015-08-15 MED ORDER — TRIAMCINOLONE ACETONIDE 10 MG/ML IJ SUSP
10.0000 mg | Freq: Once | INTRAMUSCULAR | Status: AC
  Administered 2015-08-15: 10 mg

## 2015-08-15 NOTE — Progress Notes (Signed)
   Subjective:    Patient ID: Anne MorinGeorgina C [name], female    DOB: [birth year], 75 y.o.   MRN: [id number]  HPI Patient presents with bilateral foot pain; dorsal; pt stated, "aches when walk; Left foot hurts more than right".  Patient also wants to discuss getting orthotics made.  Review of Systems  All other systems reviewed and are negative.      Objective:   Physical Exam        Assessment & Plan:

## 2015-08-17 NOTE — Progress Notes (Signed)
Subjective:     Patient ID: Alvester MorinGeorgina C Plouffe, female   DOB: 1940-08-01, 75 y.o.   MRN: [id number]  HPI issue presents stating she has a lot of pain in her feet in general and she is concerned about long-term   Review of Systems  All other systems reviewed and are negative.      Objective:   Physical Exam  Constitutional: She is oriented to person, place, and time.  Cardiovascular: Intact distal pulses.   Musculoskeletal: Normal range of motion.  Neurological: She is oriented to person, place, and time.  Skin: Skin is warm.  Nursing note and vitals reviewed.  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 At this time I went ahead and have recommended orthotics and scanned for custom orthotics to reduce plantar stresses and reviewed shoe gear modifications anti-inflammatories and physical therapy  X-ray report indicates no indications of stress fracture or advanced arthritis

## 2015-09-10 ENCOUNTER — Ambulatory Visit: Payer: Medicare Other | Admitting: *Deleted

## 2015-09-10 DIAGNOSIS — M79673 Pain in unspecified foot: Secondary | ICD-10-CM

## 2015-09-10 NOTE — Patient Instructions (Signed)

## 2015-09-10 NOTE — Progress Notes (Signed)
Patient ID: Anne [name], female   DOB: 08/28/1940, 75 y.o.   MRN: [id number] Patient presents for orthotic pick up.  Patient states she does not wear athletic shoes only wears all leather dress shoes.  Discussed with Dr. Charlsie Merlesegal will send back and adjust prescription to dress poly pro flex 3/4 with vinyl top cover.  Will call when ne orthotics arrive.

## 2016-01-21 ENCOUNTER — Ambulatory Visit: Payer: Medicare Other | Admitting: *Deleted

## 2016-01-21 DIAGNOSIS — M779 Enthesopathy, unspecified: Secondary | ICD-10-CM

## 2016-01-21 NOTE — Patient Instructions (Signed)

## 2016-01-21 NOTE — Progress Notes (Signed)
Patient ID: Anne MorinGeorgina C Koors, female   DOB: 1940/11/06, 75 y.o.   MRN: [id number]  Patient presents for orthotic pick up.  Verbal and written break in and wear instructions given.  Patient will follow up in 4 weeks if symptoms worsen or fail to improve.

## 2016-02-02 ENCOUNTER — Ambulatory Visit: Payer: Medicare Other | Admitting: Podiatry

## 2016-05-03 ENCOUNTER — Other Ambulatory Visit: Payer: Self-pay | Admitting: Family Medicine

## 2016-05-03 DIAGNOSIS — Z1231 Encounter for screening mammogram for malignant neoplasm of breast: Secondary | ICD-10-CM

## 2016-05-06 ENCOUNTER — Ambulatory Visit
Admission: RE | Admit: 2016-05-06 | Discharge: 2016-05-06 | Disposition: A | Discharge status: Home / Self Care | Payer: Medicare Other | Source: Ambulatory Visit | Attending: Family Medicine | Admitting: Family Medicine

## 2016-05-06 DIAGNOSIS — Z1231 Encounter for screening mammogram for malignant neoplasm of breast: Secondary | ICD-10-CM

## 2017-04-26 ENCOUNTER — Other Ambulatory Visit: Payer: Self-pay | Admitting: Family Medicine

## 2017-04-26 DIAGNOSIS — Z1231 Encounter for screening mammogram for malignant neoplasm of breast: Secondary | ICD-10-CM

## 2017-05-12 ENCOUNTER — Ambulatory Visit
Admission: RE | Admit: 2017-05-12 | Discharge: 2017-05-12 | Disposition: A | Discharge status: Home / Self Care | Payer: Medicare Other | Source: Ambulatory Visit | Attending: Family Medicine | Admitting: Family Medicine

## 2017-05-12 DIAGNOSIS — Z1231 Encounter for screening mammogram for malignant neoplasm of breast: Secondary | ICD-10-CM

## 2017-06-16 ENCOUNTER — Ambulatory Visit: Payer: Medicare Other

## 2017-06-17 ENCOUNTER — Ambulatory Visit: Payer: Medicare Other

## 2018-06-26 ENCOUNTER — Other Ambulatory Visit: Payer: Self-pay | Admitting: Family Medicine

## 2018-06-26 DIAGNOSIS — Z1231 Encounter for screening mammogram for malignant neoplasm of breast: Secondary | ICD-10-CM

## 2018-07-25 ENCOUNTER — Ambulatory Visit
Admission: RE | Admit: 2018-07-25 | Discharge: 2018-07-25 | Disposition: A | Discharge status: Home / Self Care | Payer: Medicare Other | Source: Ambulatory Visit | Attending: Family Medicine | Admitting: Family Medicine

## 2018-07-25 DIAGNOSIS — Z1231 Encounter for screening mammogram for malignant neoplasm of breast: Secondary | ICD-10-CM

## 2019-05-30 ENCOUNTER — Ambulatory Visit: Payer: Medicare Other | Attending: Internal Medicine

## 2019-05-30 DIAGNOSIS — Z20822 Contact with and (suspected) exposure to covid-19: Secondary | ICD-10-CM

## 2019-05-31 LAB — NOVEL CORONAVIRUS, NAA: SARS-CoV-2, NAA: NOT DETECTED

## 2019-06-05 ENCOUNTER — Ambulatory Visit: Payer: Medicare Other | Attending: Internal Medicine

## 2019-06-05 DIAGNOSIS — Z23 Encounter for immunization: Secondary | ICD-10-CM | POA: Insufficient documentation

## 2019-06-05 NOTE — Progress Notes (Signed)
   Covid-19 Vaccination Clinic  Name:  LOUISIANA SEARLES    MRN: [id number] DOB: Mar 10, 1941  06/05/2019  Ms. Hoban was observed post Covid-19 immunization for 15 minutes without incidence. She was provided with Vaccine Information Sheet and instruction to access the V-Safe system.   Ms. Amer was instructed to call 911 with any severe reactions post vaccine: Marland Kitchen Difficulty breathing  . Swelling of your face and throat  . A fast heartbeat  . A bad rash all over your body  . Dizziness and weakness    Immunizations Administered    Name Date Dose VIS Date Route   Pfizer COVID-19 Vaccine 06/05/2019 11:04 AM 0.3 mL 05/04/2019 Intramuscular   Manufacturer: [employer], [employer]   Lot: [id number]   NDC: [address]-6

## 2019-06-25 ENCOUNTER — Ambulatory Visit: Payer: Medicare PPO | Attending: Internal Medicine

## 2019-06-25 DIAGNOSIS — Z23 Encounter for immunization: Secondary | ICD-10-CM | POA: Insufficient documentation

## 2019-06-25 NOTE — Progress Notes (Signed)
   Covid-19 Vaccination Clinic  Name:  Anne [name]    MRN: [id number] DOB: August 08, 1940  06/25/2019  [name] was observed post Covid-19 immunization for 15 minutes without incidence. She was provided with Vaccine Information Sheet and instruction to access the V-Safe system.   [name] was instructed to call 911 with any severe reactions post vaccine: Marland Kitchen Difficulty breathing  . Swelling of your face and throat  . A fast heartbeat  . A bad rash all over your body  . Dizziness and weakness    Immunizations Administered    Name Date Dose VIS Date Route   Pfizer COVID-19 Vaccine 06/25/2019  9:02 AM 0.3 mL 05/04/2019 Intramuscular   Manufacturer: [employer], [employer]   Lot: [id number]   NDC: [address]-9

## 2019-07-30 ENCOUNTER — Encounter: Payer: Self-pay | Admitting: Neurology

## 2019-07-30 ENCOUNTER — Other Ambulatory Visit: Payer: Self-pay | Admitting: Family Medicine

## 2019-07-30 DIAGNOSIS — Z1231 Encounter for screening mammogram for malignant neoplasm of breast: Secondary | ICD-10-CM

## 2019-08-06 ENCOUNTER — Ambulatory Visit
Admission: RE | Admit: 2019-08-06 | Discharge: 2019-08-06 | Disposition: A | Discharge status: Home / Self Care | Payer: Medicare PPO | Source: Ambulatory Visit | Attending: Family Medicine | Admitting: Family Medicine

## 2019-08-06 ENCOUNTER — Other Ambulatory Visit: Payer: Self-pay

## 2019-08-06 DIAGNOSIS — Z1231 Encounter for screening mammogram for malignant neoplasm of breast: Secondary | ICD-10-CM

## 2019-08-23 DIAGNOSIS — R3915 Urgency of urination: Secondary | ICD-10-CM | POA: Diagnosis not present

## 2019-08-23 DIAGNOSIS — R351 Nocturia: Secondary | ICD-10-CM | POA: Diagnosis not present

## 2019-08-23 DIAGNOSIS — R350 Frequency of micturition: Secondary | ICD-10-CM | POA: Diagnosis not present

## 2019-08-27 DIAGNOSIS — M6289 Other specified disorders of muscle: Secondary | ICD-10-CM | POA: Diagnosis not present

## 2019-08-27 DIAGNOSIS — M6281 Muscle weakness (generalized): Secondary | ICD-10-CM | POA: Diagnosis not present

## 2019-08-27 DIAGNOSIS — R350 Frequency of micturition: Secondary | ICD-10-CM | POA: Diagnosis not present

## 2019-08-27 DIAGNOSIS — R3915 Urgency of urination: Secondary | ICD-10-CM | POA: Diagnosis not present

## 2019-08-28 ENCOUNTER — Other Ambulatory Visit: Payer: Self-pay | Admitting: Family Medicine

## 2019-08-28 DIAGNOSIS — K589 Irritable bowel syndrome without diarrhea: Secondary | ICD-10-CM | POA: Diagnosis not present

## 2019-08-28 DIAGNOSIS — N3941 Urge incontinence: Secondary | ICD-10-CM | POA: Diagnosis not present

## 2019-08-28 DIAGNOSIS — F3342 Major depressive disorder, recurrent, in full remission: Secondary | ICD-10-CM | POA: Diagnosis not present

## 2019-08-28 DIAGNOSIS — M85851 Other specified disorders of bone density and structure, right thigh: Secondary | ICD-10-CM | POA: Diagnosis not present

## 2019-08-28 DIAGNOSIS — L719 Rosacea, unspecified: Secondary | ICD-10-CM | POA: Diagnosis not present

## 2019-08-28 DIAGNOSIS — M8580 Other specified disorders of bone density and structure, unspecified site: Secondary | ICD-10-CM

## 2019-08-28 DIAGNOSIS — Z0000 Encounter for general adult medical examination without abnormal findings: Secondary | ICD-10-CM | POA: Diagnosis not present

## 2019-08-28 DIAGNOSIS — Z01419 Encounter for gynecological examination (general) (routine) without abnormal findings: Secondary | ICD-10-CM | POA: Diagnosis not present

## 2019-08-28 DIAGNOSIS — J301 Allergic rhinitis due to pollen: Secondary | ICD-10-CM | POA: Diagnosis not present

## 2019-09-03 DIAGNOSIS — R351 Nocturia: Secondary | ICD-10-CM | POA: Diagnosis not present

## 2019-09-03 DIAGNOSIS — R350 Frequency of micturition: Secondary | ICD-10-CM | POA: Diagnosis not present

## 2019-09-03 DIAGNOSIS — M6281 Muscle weakness (generalized): Secondary | ICD-10-CM | POA: Diagnosis not present

## 2019-09-03 DIAGNOSIS — M6289 Other specified disorders of muscle: Secondary | ICD-10-CM | POA: Diagnosis not present

## 2019-09-03 DIAGNOSIS — R3915 Urgency of urination: Secondary | ICD-10-CM | POA: Diagnosis not present

## 2019-09-04 ENCOUNTER — Ambulatory Visit
Admission: RE | Admit: 2019-09-04 | Discharge: 2019-09-04 | Disposition: A | Discharge status: Home / Self Care | Payer: Medicare PPO | Source: Ambulatory Visit | Attending: Family Medicine | Admitting: Family Medicine

## 2019-09-04 ENCOUNTER — Other Ambulatory Visit: Payer: Self-pay

## 2019-09-04 DIAGNOSIS — M8580 Other specified disorders of bone density and structure, unspecified site: Secondary | ICD-10-CM

## 2019-09-04 DIAGNOSIS — Z780 Asymptomatic menopausal state: Secondary | ICD-10-CM | POA: Diagnosis not present

## 2019-09-04 DIAGNOSIS — M85851 Other specified disorders of bone density and structure, right thigh: Secondary | ICD-10-CM | POA: Diagnosis not present

## 2019-10-30 ENCOUNTER — Encounter: Payer: Self-pay | Admitting: Neurology

## 2019-10-30 ENCOUNTER — Other Ambulatory Visit (INDEPENDENT_AMBULATORY_CARE_PROVIDER_SITE_OTHER): Payer: Medicare PPO

## 2019-10-30 ENCOUNTER — Ambulatory Visit: Payer: Medicare PPO | Admitting: Neurology

## 2019-10-30 ENCOUNTER — Other Ambulatory Visit: Payer: Self-pay

## 2019-10-30 VITALS — BP 151/80 | HR 75 | Ht 66.0 in | Wt 165.8 lb

## 2019-10-30 DIAGNOSIS — G3184 Mild cognitive impairment, so stated: Secondary | ICD-10-CM | POA: Diagnosis not present

## 2019-10-30 LAB — FOLATE: Folate: >24.8 ng/mL (ref >5.9)

## 2019-10-30 LAB — TSH: TSH: 3.42 u[IU]/mL (ref 0.35–4.50)

## 2019-10-30 LAB — VITAMIN B12: Vitamin B-12: 381 pg/mL (ref 211–911)

## 2019-10-30 NOTE — Progress Notes (Signed)
NEUROLOGY CONSULTATION NOTE  Anne [name] MRN: [id number] DOB: [birth year]  Referring provider: Dr. Gweneth Dimitri Primary care provider: Dr. Gweneth Dimitri  Reason for consult:  Memory changes  Dear Dr Barbaraann Barthel:  Thank you for your kind referral of Anne [name] for consultation of the above symptoms. Although her history is well known to you, please allow me to reiterate it for the purpose of our medical record. The patient was accompanied to the clinic by her daughter [name] who also provides collateral information. Records and images were personally reviewed where available.   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 She reports her memory is "terrible." Her daughter [name] is present to provide information, and states she started noticing changes a couple of years ago. The patient reports she has always been great with names, but now cannot remember them, which really bothers her. She misplaces things, [name] reports that she is misplacing things constantly on a daily basis, which is frustrating. This morning she could not find her medications and was frantic. [name] had been living in [place] and would provide tech support, but noticed she would not retain information even in the same conversation. She would not remember passwords and [name] would have to repeat things. The patient is very close to her near-8 year old mother who lives in [place], she and [name] went to visit her in September. [name] has been staying with her since, and has noticed worsening symptoms from baseline. She has always been "ADD," always the person looking for things even when [name] was younger, but things have gotten worse. Following a recipe can be challenging. She does not remember how to work the [employer]. They both deny getting lost driving, but [name] reports  "it is like an internal motor going, she always has to get somewhere ASAP and prefers the most direct route," then gets distracted when driving. She denies leaving the stove on. Although she misplaces her medications, she denies missing doses. Most of her bills are on autopay, [name] has helped her be more organized with her stacks of bills coming in. She feels her daughter has made things more focused for her, but that her daughter wants things "done her way, I'm not that way, I like to do things my way." She reports feeling very overwhelmed, she stayed with her mother for 3 months, and was depressed when she came back in December. [name] has been telling her she needs to sell her house but she does not want to sell. She gets tearful several times during the visit. She reports untidiness and not being able to garden like before. [name] has noticed an impulsivity that is different, she had a contractor show her windows for her home and "made the snap decision to buy $4500," and now [name] is in the process of undoing the contract. She gets angry when you try to slow her down, but they are not sure if this is new. [name] feels that something happened when she came back from [place], she has noticed a "lack of awareness, like she is not there." [name] feels like she needs a Manufacturing engineer always behind her, because she forgets to complete tasks.   She denies any dizziness, diplopia, dysarthria/dysphagia, neck/back pain, focal numbness/tingling/weakness, bowel dysfunction, anosmia. She has occasional sinus headaches and blurred vision. She has been having pains in her left foot and diagnosed  with plantar fasciitis. She has had worsening urinary incontinence that Myrbetriq has helped with. She has noticed slight hand tremors. She usually gets 5 hours of sleep which she attributes to difficulties with time change due to travel and her bladder. She needs naps. There is a strong family history of Alzheimer's disease  in her mother and 4 maternal aunts. A maternal uncle had vascular dementia. No history of significant head injuries. She reports that every night she and her daughter go through a bottle of wine (2-1/2 glasses every night). No paranoia or hallucinations.     PAST MEDICAL HISTORY: Past Medical History:  Diagnosis Date  . Anxiety   . Arthritis    oa  . Depression   . Post-nasal drip   . Strep throat 06-26-2013 on amoxicillian    PAST SURGICAL HISTORY: Past Surgical History:  Procedure Laterality Date  . ABDOMINAL HYSTERECTOMY  1988   and bladder tach done  . BREAST CYST ASPIRATION Right   . EYE SURGERY Bilateral 2013   cataracts removed both eyes  . pinkie finger surgery Right yrs ago  . 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 Cholecalciferol (VITAMIN D-3) 5000 UNITS TABS Take 1 tablet by mouth daily.    . hyoscyamine (LEVSIN SL) 0.125 MG SL tablet Place 0.125 mg under the tongue every 4 (four) hours as needed for cramping.    . Multiple Vitamin (MULTIVITAMIN WITH MINERALS) TABS tablet Take 1 tablet by mouth daily.    Marland Kitchen MYRBETRIQ 50 MG TB24 tablet     . sertraline (ZOLOFT) 100 MG tablet Take 150 mg by mouth every morning.     No current facility-administered medications on file prior to visit.    ALLERGIES: Allergies  Allergen Reactions  . Sulfa Antibiotics Nausea Only    FAMILY HISTORY: Family History  Problem Relation Age of Onset  . Breast cancer Mother 67  . Breast cancer Maternal Aunt 70  . Breast cancer Maternal Aunt 61    SOCIAL HISTORY: Social History   Socioeconomic History  . Marital status: Widowed    Spouse name: Not on file  . Number of children: Not on file  . Years of education: Not on file  .  Highest education level: Not on file  Occupational History  . Not on file  Tobacco Use  . Smoking status: Never Smoker  . Smokeless tobacco: Never Used  Substance and Sexual Activity  . Alcohol use: Yes    Comment: gin and tonic x 2 per day  . Drug use: No  . Sexual activity: Not on file  Other Topics Concern  . Not on file  Social History Narrative   Right handed    Lives with daughter    Has twins    Social Determinants of Health   Financial Resource Strain:   . Difficulty of Paying Living Expenses:   Food Insecurity:   . Worried About Programme researcher, broadcasting/film/video in the Last Year:   . Barista in the Last Year:   Transportation Needs:   . Freight forwarder (Medical):   Marland Kitchen Lack of Transportation (Non-Medical):   Physical Activity:   . Days of Exercise per Week:   . Minutes of Exercise per Session:  Stress:   . Feeling of Stress :   Social Connections:   . Frequency of Communication with Friends and Family:   . Frequency of Social Gatherings with Friends and Family:   . Attends Religious Services:   . Active Member of Clubs or Organizations:   . Attends Archivist Meetings:   Marland Kitchen Marital Status:   Intimate Partner Violence:   . Fear of Current or Ex-Partner:   . Emotionally Abused:   Marland Kitchen Physically Abused:   . Sexually Abused:     REVIEW OF SYSTEMS: Constitutional: No fevers, chills, or sweats, no generalized fatigue, change in appetite Eyes: No visual changes, double vision, eye pain Ear, nose and throat: No hearing loss, ear pain, nasal congestion, sore throat Cardiovascular: No chest pain, palpitations Respiratory:  No shortness of breath at rest or with exertion, wheezes GastrointestinaI: No nausea, vomiting, diarrhea, abdominal pain, fecal incontinence Genitourinary:  No dysuria, urinary retention or frequency Musculoskeletal:  No neck pain, back pain Integumentary: No rash, pruritus, skin lesions Neurological: as above Psychiatric: +  depression, insomnia, anxiety Endocrine: No palpitations, fatigue, diaphoresis, mood swings, change in appetite, change in weight, increased thirst Hematologic/Lymphatic:  No anemia, purpura, petechiae. Allergic/Immunologic: no itchy/runny eyes, nasal congestion, recent allergic reactions, rashes  PHYSICAL EXAM: Vitals:   10/30/19 0901  BP: (!) 151/80  Pulse: 75  SpO2: 96%   General: No acute distress, tearful Head:  Normocephalic/atraumatic Skin/Extremities: No rash, no edema Neurological Exam: Mental status: alert and oriented to person, place, and time, no dysarthria or aphasia, Fund of knowledge is appropriate.  Recent and remote memory are intact.  Attention and concentration are normal. SLUMS score 27/30.  [facility] Mental Exam 10/30/2019  Weekday Correct 1  Current year 1  What state are we in? 1  Amount spent 1  Amount left 2  # of Animals 3  5 objects recall 5  Number series 1  Hour markers 2  Time correct 2  Placed X in triangle correctly 1  Largest Figure 1  Name of female 2  Date back to work 0  Type of work 2  State she lived in 2  Total score 27    Cranial nerves: CN I: not tested CN II: pupils equal, round and reactive to light, visual fields intact CN III, IV, VI:  full range of motion, no nystagmus, no ptosis CN V: facial sensation intact CN VII: upper and lower face symmetric CN VIII: hearing intact to conversation Bulk & Tone: normal, no fasciculations, no cogwheeling Motor: 5/5 throughout with no pronator drift. Sensation: intact to light touch, cold, pin, vibration and joint position sense.  No extinction to double simultaneous stimulation.  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 Her neurological exam is non-focal, SLUMS score today 27/30. 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 We discussed different causes of memory changes, check TSH, B12, B1, folate. MRI brain without contrast will be ordered to assess for underlying structural abnormality. She will be scheduled for Neurocognitive testing to further evaluate cognitive concerns. We discussed how mood/stress can also affect memory, as well as alcohol  intake. We discussed reduction in alcohol intake. Follow-up after tests, they know to call for any changes.   Thank you for allowing me to participate in the care of this patient. Please do not hesitate to call for any questions or concerns.   Patrcia Dolly, M.D.  CC: Dr. Corliss Blacker, Dr. Barbaraann Barthel

## 2019-10-30 NOTE — Patient Instructions (Addendum)
1. Bloodwork for TSH, B12, B1, folic acid  2. Schedule MRI brain without contrast  3. Schedule Neurocognitive testing  4. Recommend reducing alcohol intake as this can also affect memory  5. Follow-up after testing, call for any changes   RECOMMENDATIONS FOR ALL PATIENTS WITH MEMORY PROBLEMS: 1. Continue to exercise (Recommend 30 minutes of walking everyday, or 3 hours every week) 2. Increase social interactions - continue going to [place] and enjoy social gatherings with friends and family 3. Eat healthy, avoid fried foods and eat more fruits and vegetables 4. Maintain adequate blood pressure, blood sugar, and blood cholesterol level. Reducing the risk of stroke and cardiovascular disease also helps promoting better memory. 5. Avoid stressful situations. Live a simple life and avoid aggravations. Organize your time and prepare for the next day in anticipation. 6. Sleep well, avoid any interruptions of sleep and avoid any distractions in the bedroom that may interfere with adequate sleep quality 7. Avoid sugar, avoid sweets as there is a strong link between excessive sugar intake, diabetes, and cognitive impairment The Mediterranean diet has been shown to help patients reduce the risk of progressive memory disorders and reduces cardiovascular risk. This includes eating fish, eat fruits and green leafy vegetables, nuts like almonds and hazelnuts, walnuts, and also use olive oil. Avoid fast foods and fried foods as much as possible. Avoid sweets and sugar as sugar use has been linked to worsening of memory function.  There is always a concern of gradual progression of memory problems. If this is the case, then we may need to adjust level of care according to patient needs. Support, both to the patient and caregiver, should then be put into place.

## 2019-11-01 ENCOUNTER — Ambulatory Visit (INDEPENDENT_AMBULATORY_CARE_PROVIDER_SITE_OTHER): Payer: Medicare PPO | Admitting: Psychology

## 2019-11-01 ENCOUNTER — Other Ambulatory Visit: Payer: Self-pay

## 2019-11-01 ENCOUNTER — Ambulatory Visit: Payer: Medicare PPO | Admitting: Psychology

## 2019-11-01 ENCOUNTER — Encounter: Payer: Self-pay | Admitting: Psychology

## 2019-11-01 DIAGNOSIS — R4189 Other symptoms and signs involving cognitive functions and awareness: Secondary | ICD-10-CM

## 2019-11-01 DIAGNOSIS — F419 Anxiety disorder, unspecified: Secondary | ICD-10-CM | POA: Diagnosis not present

## 2019-11-01 DIAGNOSIS — G3184 Mild cognitive impairment, so stated: Secondary | ICD-10-CM

## 2019-11-01 DIAGNOSIS — R41840 Attention and concentration deficit: Secondary | ICD-10-CM | POA: Diagnosis not present

## 2019-11-01 HISTORY — DX: Mild cognitive impairment of uncertain or unknown etiology: G31.84

## 2019-11-01 NOTE — Progress Notes (Signed)
NEUROPSYCHOLOGICAL EVALUATION [place] Department of Neurology  Date of Evaluation: November 01, 2019  Reason for Referral:   Anne [name] is a 79 y.o. right-handed Caucasian female referred by Patrcia Dolly, M.D., to characterize her current cognitive functioning and assist with diagnostic clarity and treatment planning in the context of subjective cognitive decline.   Assessment and Plan:   Clinical Impression(s): Anne [name] pattern of performance is suggestive of primary weaknesses surrounding attention/concentration; this also impacted receptive language as greater difficulties were noted as verbal working memory demands increased. Additional performance variability was exhibited across processing speed, executive functioning, and encoding (i.e., learning) aspects of memory. Recognition trials across verbal memory measures were unexpectedly poor despite average scores across delayed recall measures and strong overall retention percentages. Performance was appropriate across expressive language and visuospatial abilities. Anne [name] largely denied difficulties completing instrumental activities of daily living (ADLs) independently. However, her daughter has assisted with streamlining financial management and bill paying. 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  The etiology of these weaknesses is unclear. Across a childhood ADHD questionnaire, she did score in the "likely to have ADHD" range across the inattention subscale. While this questionnaire is not diagnostic of ADHD, it could suggest that she has traits of this condition at the very least. This would be consistent with both her and her daughter's report of longstanding weaknesses with attention/concentration dating back at least into young  adulthood. While there is no pattern of performance specific to ADHD across cognitive testing, deficits in cognitive control, (i.e., weaknesses in attention/concentration, working memory, processing speed, executive functioning, and encoding aspects of memory) are consistent with what one would expect to see. Cognitive control issues could also be exacerbated by poor sleep, headaches, chronic pain, and mild symptoms of generalized anxiety. In addition, Anne [name] does have a strong family history of Alzheimer's disease. Memory scores exhibited a difficult to explain pattern of poor recognition despite adequate retrieval/retention. While I cannot rule out the very early start of a neurodegenerative illness, average retrieval scores with retention rates ranging from 88-100% are not consistent with the typical Alzheimer's disease presentation. Continued medical monitoring will be important moving forward.   Recommendations: A repeat neuropsychological evaluation in 18-24 months (or sooner if functional decline is noted) is recommended to assess the trajectory of future cognitive decline should it occur. This will also aid in future efforts towards improved diagnostic clarity.  For women, the CDC defines "heavy drinking" as consuming 8 or more alcoholic beverages per week. Based upon Anne [name] report, she is consuming around 14 drinks per week. Chronic "heavy" alcohol use can increase her risk for future health complications. It could also be influencing cognitive performance to a mild extent. As such, she is encouraged to decrease her weekly alcohol intake.   Anne [name] is encouraged to attend to lifestyle factors for brain health (e.g., regular physical exercise, good nutrition habits, regular participation in cognitively-stimulating activities, and general stress management techniques), which are likely to have benefits for both emotional adjustment and cognition. In fact, in addition to promoting good general  health, regular exercise incorporating aerobic activities (e.g., brisk walking, jogging, cycling, etc.) has been demonstrated to be a very effective treatment for depression and stress, with similar efficacy rates to both antidepressant medication and psychotherapy. Optimal control of vascular risk factors (including safe cardiovascular exercise and  adherence to dietary recommendations) is encouraged.  Given report of increased impulsivity raised by her daughter, it will be important for Anne [name] to have another person with her when in situations where she may need to process information, weigh the pros and cons of different options, and make decisions, in order to ensure that she fully understands and recalls all information to be considered.  If interested, there are some activities which have therapeutic value and can be useful in keeping her cognitively stimulated. For suggestions, Anne [name] is encouraged to go to the following website: https://www.[facility].org/get-to-know-barrow/centers-programs/neurorehabilitation-center/neuro-rehab-apps-and-games/ which has options, categorized by level of difficulty. It should be noted that these activities should not be viewed as a substitute for therapy.  When learning new information, she would benefit from information being broken up into small, manageable pieces. She may also find it helpful to articulate the material in her own words and in a context to promote encoding at the onset of a new task. This material may need to be repeated multiple times to promote encoding.  Memory can be improved using internal strategies such as rehearsal, repetition, chunking, mnemonics, association, and imagery. External strategies such as written notes in a consistently used memory journal, visual and nonverbal auditory cues such as a calendar on the refrigerator or appointments with alarm, such as on a cell phone, can also help maximize recall.    To address problems with  processing speed, she may wish to consider:   -Ensuring that she is alerted when essential material or instructions are being presented   -Adjusting the speed at which new information is presented   -Allowing for more time in comprehending, processing, and responding in conversation  To address problems with fluctuating attention, she may wish to consider:   -Avoiding external distractions when needing to concentrate   -Limiting exposure to fast paced environments with multiple sensory demands   -Writing down complicated information and using checklists   -Attempting and completing one task at a time (i.e., no multi-tasking)   -Verbalizing aloud each step of a task to maintain focus   -Reducing the amount of information considered at one time  Review of Records:   Anne [name] was seen by [facility] Marland KitchenEllouise Newer, M.D.) on 10/30/2019 for an evaluation of memory loss. Briefly, she described her memory is "terrible." Her daughter Hazle Quant reported that she started noticing changes in her mother a couple of years prior. Anne [name] reported always being great with names but now cannot remember them. She also has a tendency to misplace things. Anne [name] reported that her mother seems to be misplacing things very regularly. Anne [name] had been living in [place] and would provide tech support (e.g., assistance with technical issues like online bill paying or how to work her television or smartphone), but noticed that her mother would not retain information even in the same conversation. Anne [name] noted that her mother has always been "ADD" but things have seemingly gotten worse. Following a recipe can also be challenging. They both denied getting lost driving. However, Anne [name] reported "it is like an internal motor going, she always has to get somewhere ASAP and prefers the most direct route," then gets distracted when driving. Although she misplaces her medications, Anne [name] denied missing doses. Most of her bills are on  autopay and Anne [name] has helped her be more organized with her stacks of bills come in. Anne [name] reported feeling very overwhelmed. Anne [name] has been telling her she needs to sell her house but she does not  want to sell. She reports untidiness and not being able to garden like before. Anne [name] has noticed an impulsivity that seems different. For example, her mother had a contractor show her windows for her home and "made the snap decision to buy $4500." Now Anne [name] is in the process of undoing the contract.   Anne [name] denied any dizziness, diplopia, dysarthria/dysphagia, neck/back pain, focal numbness/tingling/weakness, bowel dysfunction, or anosmia. She has occasional sinus headaches and blurred vision. She has been having pains in her left foot and was diagnosed with plantar fasciitis. She has had worsening urinary incontinence that Myrbetriq has helped with. She has noticed slight hand tremors. She usually gets 5 hours of sleep which she attributes to difficulties with time change due to travel and her bladder. There is a strong family history of Alzheimer's disease in her mother and 4 maternal aunts. A maternal uncle had vascular dementia. She denied a history of significant head injuries. She reported that every night she and her daughter go through a bottle of wine (2-1/2 glasses every night). Paranoia or hallucinations were denied. Performance on a brief cognitive screening instrument (SLUMS) was 27/30. Ultimately, Anne [name] was referred for a comprehensive neuropsychological evaluation to characterize her cognitive abilities and to assist with diagnostic clarity and treatment planning.   A brain MRI was scheduled to be completed on 11/30/2019. No neuroimaging was available for review.  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   Procedure: LEFT TOTAL HIP ARTHROPLASTY ANTERIOR APPROACH;  Surgeon: Shelda Pal, MD;  Location: [facility];  Service: Orthopedics;  Laterality: Left;    Current Outpatient Medications:    Cholecalciferol (VITAMIN D-3) 5000 UNITS TABS, Take 1 tablet by mouth daily., Disp: , Rfl:    hyoscyamine (LEVSIN SL) 0.125 MG SL tablet, Place 0.125 mg under the tongue every 4 (four) hours as needed for cramping., Disp: , Rfl:    Multiple Vitamin (MULTIVITAMIN WITH MINERALS) TABS tablet, Take 1 tablet by mouth daily., Disp: , Rfl:    MYRBETRIQ 50 MG TB24 tablet, , Disp: , Rfl:    sertraline (ZOLOFT) 100 MG tablet, Take 150 mg by mouth every morning., Disp: , Rfl:   Clinical Interview:   Cognitive Symptoms: Decreased short-term memory: Endorsed. Anne [name] described her primary difficulties surrounding the misplacing of objects and trouble recalling names of familiar individuals. Her daughter expressed concerns that she does not retain information well during conversations and that she seems far more easily confused than what is typical. Cognitive deficits were reported to be observable for the past 2-3 years and have seemingly worsened over that period of time. Anne [name] attributed at least some of this to ongoing psychosocial stressors.  Decreased long-term memory: Denied. Decreased attention/concentration: Endorsed. She also reported an increase in distractibility, noting that after she gets distracted, she will forgot what she was originally doing. Her daughter described longstanding trouble with organization and described her mother as potentially having undiagnosed ADHD. She also  noted that her mother appears to have an  over-active mind in that she constantly has thoughts running through her mind which are likely distracting and limit her ability to focus on what is going on around her.  Reduced processing speed: Endorsed. Difficulties with executive functions: Endorsed. In addition to trouble with organization, her daughter described several instances of increased impulsivity. As mentioned above, Anne [name] seemingly impulsively signed a contract to have new windows installed on the spot rather than allowing herself time to think and consider her options. Her daughter also described an instance where she seemed ready and willing to hand over her credit card to pay for a large sum of auto repairs, some of which might not have been necessary. Overall, her daughter noted that her mother seems very confident in her decision making at the time, but then will second guess herself shortly afterwards, often after it is too late to make any changes.  Difficulties with emotion regulation: Denied. Difficulties with receptive language: Denied assuming she can hear the source of the sound adequately.  Difficulties with word finding: Endorsed. She also reported a subjective decline in her vocabulary while conversing with others.  Decreased visuoperceptual ability: Denied.  Difficulties completing ADLs: Somewhat. She reported managing her medications independently, noting infrequent instances where she may forget to take her morning doses. Her daughter has had to assist with organizing bill paying pursuits and financial management. Many of her bills are currently on auto-pay. Driving related difficulties were denied.   Additional Medical History: History of traumatic brain injury/concussion: Denied. History of stroke: Denied. History of seizure activity: Denied. History of known exposure to toxins: Denied. Symptoms of chronic pain: Endorsed. She reported being somewhat recently diagnosed with plantar fasciitis, which has led to some  chronic foot pain. She also reported sporadic hip pain despite undergoing a hip replacement procedure approximately 6 years prior.  Experience of frequent headaches/migraines: Endorsed. She reported tension headaches occurring a couple times per week. These were largely attributed to stress/anxiety. She reported trying to increase her hydration and generally tries to wait out the symptoms rather than rely on OTC medications.  Frequent instances of dizziness/vertigo: Denied.  Sensory changes: She wears glasses with positive effect and acknowledged ongoing mild hearing loss. She denied using hearing aids and reported her last audiology exam as likely occurring many years prior. Other sensory changes/difficulties (e.g., taste or smell) were denied.  Balance/coordination difficulties: Endorsed. She reported her balance "not being the same" since undergoing her hip replacement procedure 6 years prior. However, she denied recent falls and engages in water aerobics regularly to help strengthen her body.  Other motor difficulties: Endorsed. She acknowledged a subtle tremor in her right hand. Her daughter reported observing this tremor more readily lately, noting that it seems to occur when she is using her hands and can occur bilaterally.   Sleep History: Estimated hours obtained each night: 5 hours.  Difficulties falling asleep: Endorsed "occasionally."  Difficulties staying asleep: Endorsed. These were related to bladder difficulties and needing to use the restroom frequently throughout the night.  Feels rested and refreshed upon awakening: Denied. Lately, she reported feeling fatigued upon awakening.   History of snoring: Endorsed. History of waking up gasping for air: Denied. Witnessed breath cessation while asleep: Denied.  History of vivid dreaming: Denied. Excessive movement while asleep: Denied. Instances of acting out her dreams: Denied. She did acknowledge very infrequent instances where she  has jumped out of bed while asleep. However, these were not said  to occur with any regularity.   Psychiatric/Behavioral Health History: Depression: Endorsed. She described her current mood as "up and down" and acknowledged a history of depression with medication intervention. Medication efforts were said to be helpful. Current or remote suicidal ideation, intent, or plan were denied.  Anxiety: Endorsed. Symptoms of anxiety were largely generalized in nature. However, she did allude to some family distress surrounding her mother's health and a poor relationship with her sister which likely causes additional stress.  Mania: Denied. Trauma History: Denied. Visual/auditory hallucinations: Denied. Delusional thoughts: Denied.  Tobacco: Denied. Alcohol: She reported that her and her daughter will consume a bottle of wine per night. She stated that she will generally consume 2-2.5 glasses per night. A history of problematic alcohol abuse or dependence was denied.  Recreational drugs: Denied. Caffeine: Denied.   Family History: Problem Relation Age of Onset   Breast cancer Mother 51   Alzheimer's disease Mother    Breast cancer Maternal Aunt 65   Alzheimer's disease Maternal Aunt    Breast cancer Maternal Aunt 50   Vascular Disease Maternal Uncle        Vascular dementia presentation   This information was confirmed by Ms. Ronaldo Miyamoto.  Academic/Vocational History: Highest level of educational attainment: 16 years. She was born in [place] and moved to the [place] at around the age of 51. She completed high school and earned a Oncologist in teaching from the [employer]. She described herself as a strong (A/B) student in academic settings. No relative weaknesses were identified.  History of developmental delay: Denied. History of grade repetition: Denied. Enrollment in special education courses: Denied. History of LD/ADHD: Denied.  Employment: Retired. She  previously worked as a Runner, broadcasting/film/video for many years.   Evaluation Results:   Behavioral Observations: Ms. Solomon was accompanied by her daughter, arrived to her appointment on time, and was appropriately dressed and groomed. She appeared alert and oriented. Observed gait and station were within normal limits. Gross motor functioning appeared intact upon informal observation and no abnormal movements (e.g., tremors) were noted. Her affect was generally relaxed and positive, but did range appropriately given the subject being discussed during the clinical interview or the task at hand during testing procedures. There were times where she appeared mildly defensive, especially when her daughter's report of memory dysfunction or impulsive behavior contradicted her own. Spontaneous speech was fluent and word finding difficulties were not observed during the clinical interview. Thought processes were coherent, organized, and normal in content. Insight into her cognitive difficulties appeared largely appropriate. During testing, sustained attention was appropriate. Task engagement was adequate and she persisted when challenged. She did briefly become tearful while completing a depression screening instrument. Overall, Ms. Baus was cooperative with the clinical interview and subsequent testing procedures.   Adequacy of Effort: The validity of neuropsychological testing is limited by the extent to which the individual being tested may be assumed to have exerted adequate effort during testing. Anne [name] expressed her intention to perform to the best of her abilities and exhibited adequate task engagement and persistence. Scores across stand-alone and embedded performance validity measures were within expectation. As such, the results of the current evaluation are believed to be a valid representation of Ms. Klontz current cognitive functioning.  Test Results: Ms. Kosel was fully oriented at the time of the current  evaluation.  Intellectual abilities based upon educational and vocational attainment were estimated to be in the average range. Premorbid abilities were estimated to be  within the above average range based upon a single-word reading test.   Processing speed was variable, but generally in the average to above average normative ranges. Performance on a single task assessing visuomotor processing speed (TMT A) was well below average. Basic attention was below average. More complex attention (e.g., working memory) was well below average. Executive functioning was below average to above average.  Assessed receptive language abilities were below average with increased difficulties as verbal working memory demands were increased. Sentence repetition was well below average, while performance on a semantic knowledge screening test was within expectation. Assessed expressive language was variable. Phonemic fluency was well above average, semantic fluency was average, and confrontation naming was below average (raw score of 28/31).     Assessed visuospatial/visuoconstructional abilities were above average.    Learning (i.e., encoding) of novel verbal and visual information was mildly variable, ranging from the below average to above average normative ranges. Spontaneous delayed recall (i.e., retrieval) of previously learned information was average. Retention rates were 100% across a story learning task, 88% across a list learning task, and 100% across a shape learning task. Performance across recognition tasks was variable, with scores across verbal measures being unexpectedly below expectation despite strong spontaneous retention. Overall, there is evidence for information consolidation.   Results of emotional screening instruments suggested that recent symptoms of generalized anxiety were in the mild range, while symptoms of depression were within normal limits. A screening instrument assessing recent sleep  quality suggested the presence of mild sleep dysfunction. Responses across a childhood ADHD symptom questionnaire scored in the "likely to have ADHD" range across inattention and in the "unlikely to have ADHD" range across hyperactivity/impulsivity.   Tables of Scores:   Note: This summary of test scores accompanies the interpretive report and should not be considered in isolation without reference to the appropriate sections in the text. Descriptors are based on appropriate normative data and may be adjusted based on clinical judgment. The terms impaired and within normal limits (WNL) are used when a more specific level of functioning cannot be determined.       Effort Testing:   DESCRIPTOR       Dot Counting Test: --- --- Within Expectation  CVLT-III Forced Choice Recognition: --- --- Within Expectation  BVMT-R Retention Percentage: --- --- Within Expectation  D-KEFS Color Word Effort Index: --- --- Within Expectation       Orientation:      Raw Score Percentile   NAB Orientation, Form 1 29/29 --- ---       Intellectual Functioning:           Standard Score Percentile   Test of Premorbid Functioning: 113 81 Above Average       Memory:          Wechsler Memory Scale (WMS-IV):                       Raw Score (Scaled Score) Percentile     Logical Memory I 17/53 (6) 9 Below Average    Logical Memory II 14/39 (9) 37 Average    Logical Memory Recognition 13/23 3-9 Well Below Average       [place] Verbal Learning Test (CVLT-III) Brief Form: Raw Score (Scaled/Standard Score) Percentile     Total Trials 1-4 29/36 (114) 82 Above Average    Short-Delay Free Recall 7/9 (10) 50 Average    Long-Delay Free Recall 7/9 (11) 63 Average    Long-Delay Cued Recall 7/9 (11)  63 Average      Recognition Hits 9/9 (13) 84 Above Average      False Positive Errors 5 (2) <1 Exceptionally Low       Brief Visuospatial Memory Test (BVMT-R), Form 1: Raw Score (T Score) Percentile     Total Trials  1-3 17/36 (45) 31 Average    Delayed Recall 9/12 (55) 69 Average    Recognition Discrimination Index 6 >16 Within Normal Limits      Recognition Hits 6/6 >16 Within Normal Limits      False Positive Errors 0 >16 Within Normal Limits        Attention/Executive Function:          Trail Making Test (TMT): Raw Score (T Score) Percentile     Part A 57 secs.,  0 errors (36) 8 Well Below Average    Part B 133 secs.,  2 errors (39) 14 Below Average         Scaled Score Percentile   WAIS-IV Coding: 12 75 Above Average       NAB Attention Module, Form 1: T Score Percentile     Digits Forward 38 12 Below Average    Digits Backwards 34 5 Well Below Average       D-KEFS Color-Word Interference Test: Raw Score (Scaled Score) Percentile     Color Naming 33 secs. (10) 50 Average    Word Reading 18 secs. (14) 91 Above Average    Inhibition 66 secs. (11) 63 Average      Total Errors 0 errors (13) 84 Above Average    Inhibition/Switching 81 secs. (10) 50 Average      Total Errors 0 errors (13) 84 Above Average       D-KEFS 20 Questions Test: Scaled Score Percentile     Total Weighted Achievement Score 8 25 Average    Initial Abstraction Score 11 63 Average       Wisconsin Card Sorting Test: Raw Score Percentile     Categories (trials) 2 (64) >16 Within Normal Limits    Total Errors 31 14 Below Average    Perseverative Errors 17 25 Average    Non-Perseverative Errors 14 9 Below Average    Failure to Maintain Set 0 --- ---       Language:           Raw Score Percentile   PPVT Screening Instrument: 16/16 --- Within Expectation  Sentence Repetition: 12/22 3 Well Below Average       Verbal Fluency Test: Raw Score (T Score) Percentile     Phonemic Fluency (FAS) 59 (65) 93 Well Above Average    Animal Fluency 21 (55) 69 Average        NAB Language Module, Form 1: T Score Percentile     Auditory Comprehension 41 18 Below Average    Naming 28/31 (40) 16 Below Average        Visuospatial/Visuoconstruction:      Raw Score Percentile   Clock Drawing: 8/10 --- Within Normal Limits       NAB Spatial Module, Form 1: T Score Percentile     Figure Drawing Copy 57 75 Above Average        Scaled Score Percentile   WAIS-IV Block Design: 12 75 Above Average       Mood and Personality:      Raw Score Percentile   Geriatric Depression Scale: 9 --- Within Normal Limits  Geriatric Anxiety Scale: 12 --- Mild  Somatic 3 --- Minimal    Cognitive 3 --- Mild    Affective 6 --- Mild       Additional Questionnaires:          Adult Self-Report Scale (Childhood): Raw Score Percentile     Inattention 17 --- Likely to have ADHD    Hyperactive/Impulsive 12 --- Unlikely to have ADHD       PROMIS Sleep Disturbance Questionnaire: 25 --- Mild   Informed Consent and Coding/Compliance:   Ms. Kochel was provided with a verbal description of the nature and purpose of the present neuropsychological evaluation. Also reviewed were the foreseeable risks and/or discomforts and benefits of the procedure, limits of confidentiality, and mandatory reporting requirements of this provider. The patient was given the opportunity to ask questions and receive answers about the evaluation. Oral consent to participate was provided by the patient.   This evaluation was conducted by Newman Nickels, Ph.D., licensed clinical neuropsychologist. Ms. Marsan completed a comprehensive clinical interview with Dr. Milbert Coulter, billed as one unit [phone], and 150 minutes of cognitive testing and scoring, billed as one unit [phone] and four additional units 96139. Psychometrist Wallace Keller, B.S., assisted Dr. Milbert Coulter with test administration and scoring procedures. As a separate and discrete service, Dr. Milbert Coulter spent a total of 180 minutes in interpretation and report writing billed as one unit [phone] and two units 96133.

## 2019-11-01 NOTE — Progress Notes (Signed)
° °  Psychometrician Note   Cognitive testing was administered to Anne [name] by Wallace Keller, B.S. (psychometrist) under the supervision of Dr. Newman Nickels, Ph.D., licensed psychologist on 11/01/19. [name] did not appear overtly distressed by the testing session per behavioral observation or responses across self-report questionnaires. Dr. Newman Nickels, Ph.D. checked in with [name] as needed to manage any distress related to testing procedures (if applicable). Rest breaks were offered.    The battery of tests administered was selected by Dr. Newman Nickels, Ph.D. with consideration to [name] current level of functioning, the nature of her symptoms, emotional and behavioral responses during interview, level of literacy, observed level of motivation/effort, and the nature of the referral question. This battery was communicated to the psychometrist. Communication between Dr. Newman Nickels, Ph.D. and the psychometrist was ongoing throughout the evaluation and Dr. Newman Nickels, Ph.D. was immediately accessible at all times. Dr. Newman Nickels, Ph.D. provided supervision to the psychometrist on the date of this service to the extent necessary to assure the quality of all services provided.    Anne [name] will return within approximately 1-2 weeks for an interactive feedback session with Dr. Milbert Coulter at which time her test performances, clinical impressions, and treatment recommendations will be reviewed in detail. [name] understands she can contact our office should she require our assistance before this time.  A total of 150 minutes of billable time were spent face-to-face with [name] by the psychometrist. This includes both test administration and scoring time. Billing for these services is reflected in the clinical report generated by Dr. Newman Nickels, Ph.D..  This note reflects time spent with the psychometrician and does not include test scores or any clinical  interpretations made by Dr. Milbert Coulter. The full report will follow in a separate note.

## 2019-11-03 LAB — VITAMIN B1: Vitamin B1 (Thiamine): 21 nmol/L (ref 8–30)

## 2019-11-05 ENCOUNTER — Telehealth: Payer: Self-pay

## 2019-11-05 NOTE — Telephone Encounter (Signed)
Pt called no answer voice mail left for pt to call the office back  

## 2019-11-05 NOTE — Telephone Encounter (Signed)
-----   Message from Karen M Aquino, MD sent at 11/05/2019  3:29 PM EDT ----- Pls let her know bloodwork was normal, thanks 

## 2019-11-06 ENCOUNTER — Telehealth: Payer: Self-pay

## 2019-11-06 NOTE — Telephone Encounter (Signed)
-----   Message from Van Clines, MD sent at 11/05/2019  3:29 PM EDT ----- Pls let her know bloodwork was normal, thanks

## 2019-11-06 NOTE — Telephone Encounter (Signed)
Pt called no answer voice mail left for her to call the office, also my-chart message sent with results

## 2019-11-08 ENCOUNTER — Ambulatory Visit (INDEPENDENT_AMBULATORY_CARE_PROVIDER_SITE_OTHER): Payer: Medicare PPO | Admitting: Psychology

## 2019-11-08 ENCOUNTER — Other Ambulatory Visit: Payer: Self-pay

## 2019-11-08 DIAGNOSIS — R41840 Attention and concentration deficit: Secondary | ICD-10-CM

## 2019-11-08 NOTE — Patient Instructions (Addendum)
Recommendations: A repeat neuropsychological evaluation in 18-24 months (or sooner if functional decline is noted) is recommended to assess the trajectory of future cognitive decline should it occur. This will also aid in future efforts towards improved diagnostic clarity.  For women, the CDC defines "heavy drinking" as consuming 8 or more alcoholic beverages per week. Based upon Ms. Anne [name] report, she is consuming around 14 drinks per week. Chronic "heavy" alcohol use can increase her risk for future health complications. It could also be influencing cognitive performance to a mild extent. As such, she is encouraged to decrease her weekly alcohol intake.   Ms. Anne [name] is encouraged to attend to lifestyle factors for brain health (e.g., regular physical exercise, good nutrition habits, regular participation in cognitively-stimulating activities, and general stress management techniques), which are likely to have benefits for both emotional adjustment and cognition. In fact, in addition to promoting good general health, regular exercise incorporating aerobic activities (e.g., brisk walking, jogging, cycling, etc.) has been demonstrated to be a very effective treatment for depression and stress, with similar efficacy rates to both antidepressant medication and psychotherapy. Optimal control of vascular risk factors (including safe cardiovascular exercise and adherence to dietary recommendations) is encouraged.  Given report of increased impulsivity raised by her daughter, it will be important for Anne [name] to have another person with her when in situations where she may need to process information, weigh the pros and cons of different options, and make decisions, in order to ensure that she fully understands and recalls all information to be considered.  If interested, there are some activities which have therapeutic value and can be useful in keeping her cognitively stimulated. For suggestions, Anne [name] is  encouraged to go to the following website: https://www.[facility].org/get-to-know-barrow/centers-programs/neurorehabilitation-center/neuro-rehab-apps-and-games/ which has options, categorized by level of difficulty. It should be noted that these activities should not be viewed as a substitute for therapy.  When learning new information, she would benefit from information being broken up into small, manageable pieces. She may also find it helpful to articulate the material in her own words and in a context to promote encoding at the onset of a new task. This material may need to be repeated multiple times to promote encoding.  Memory can be improved using internal strategies such as rehearsal, repetition, chunking, mnemonics, association, and imagery. External strategies such as written notes in a consistently used memory journal, visual and nonverbal auditory cues such as a calendar on the refrigerator or appointments with alarm, such as on a cell phone, can also help maximize recall.    To address problems with processing speed, she may wish to consider:   -Ensuring that she is alerted when essential material or instructions are being presented   -Adjusting the speed at which new information is presented   -Allowing for more time in comprehending, processing, and responding in conversation  To address problems with fluctuating attention, she may wish to consider:   -Avoiding external distractions when needing to concentrate   -Limiting exposure to fast paced environments with multiple sensory demands   -Writing down complicated information and using checklists   -Attempting and completing one task at a time (i.e., no multi-tasking)   -Verbalizing aloud each step of a task to maintain focus   -Reducing the amount of information considered at one time

## 2019-11-08 NOTE — Progress Notes (Signed)
   Neuropsychology Feedback Session Eligha Bridegroom. [facility] [place] Department of Neurology  Reason for Referral:   Anne [name] a 79 y.o. right-handed Caucasian female referred by Patrcia Dolly, M.D.,to characterize hercurrent cognitive functioning and assist with diagnostic clarity and treatment planning in the context of subjective cognitive decline.   Feedback:   Anne [name] completed a comprehensive neuropsychological evaluation on 11/01/2019. Please refer to that encounter for the full report and recommendations. Briefly, results suggested primary weaknesses surrounding attention/concentration; this also impacted receptive language as greater difficulties were noted as verbal working memory demands increased. Additional performance variability was exhibited across processing speed, executive functioning, and encoding (i.e., learning) aspects of memory. The etiology of these weaknesses is unclear. Across a childhood ADHD questionnaire, she did score in the "likely to have ADHD" range across the inattention subscale. While this questionnaire is not diagnostic of ADHD, it could suggest that she has traits of this condition at the very least. This would be consistent with both her and her daughter's report of longstanding weaknesses with attention/concentration dating back at least into young adulthood. While there is no pattern of performance specific to ADHD across cognitive testing, deficits in cognitive control, (i.e., weaknesses in attention/concentration, working memory, processing speed, executive functioning, and encoding aspects of memory) are consistent with what one would expect to see. Cognitive control issues could also be exacerbated by poor sleep, headaches, chronic pain, and mild symptoms of generalized anxiety. In addition, Anne [name] does have a strong family history of Alzheimer's disease. Memory scores exhibited a difficult to explain pattern of poor recognition despite  adequate retrieval/retention. While I cannot rule out the very early start of a neurodegenerative illness, average retrieval scores with retention rates ranging from 88-100% are not consistent with the typical Alzheimer's disease presentation.  Anne [name] was accompanied by her daughter. Content of the current session focused on the results of her neuropsychological evaluation. Anne [name] and her daughter were given the opportunity to ask questions and their questions were answered. They were encouraged to reach out should additional questions arise. A copy of her report was provided at the conclusion of the visit.      30 minutes were spent conducting the current feedback session with Anne [name], billed as one unit [phone].

## 2019-11-12 ENCOUNTER — Other Ambulatory Visit: Payer: Medicare PPO

## 2019-11-20 ENCOUNTER — Telehealth: Payer: Self-pay

## 2019-11-20 NOTE — Telephone Encounter (Signed)
Called to report lab results, no answer

## 2019-11-30 ENCOUNTER — Other Ambulatory Visit: Payer: Self-pay

## 2019-11-30 ENCOUNTER — Ambulatory Visit
Admission: RE | Admit: 2019-11-30 | Discharge: 2019-11-30 | Disposition: A | Discharge status: Home / Self Care | Payer: Medicare PPO | Source: Ambulatory Visit | Attending: Neurology | Admitting: Neurology

## 2019-11-30 DIAGNOSIS — R410 Disorientation, unspecified: Secondary | ICD-10-CM | POA: Diagnosis not present

## 2019-11-30 DIAGNOSIS — R413 Other amnesia: Secondary | ICD-10-CM | POA: Diagnosis not present

## 2019-11-30 DIAGNOSIS — G3184 Mild cognitive impairment, so stated: Secondary | ICD-10-CM

## 2019-12-03 ENCOUNTER — Telehealth: Payer: Self-pay

## 2019-12-03 NOTE — Telephone Encounter (Signed)
Spoke to pt informed her that  MRI brain looked good, no evidence of tumor, stroke, or bleed. Pt asking when she needs to do her follow up appointment, she has done her testing with dr Milbert Coulter, blood work and MRI.

## 2019-12-03 NOTE — Telephone Encounter (Signed)
-----   Message from Van Clines, MD sent at 12/03/2019  8:52 AM EDT ----- Pls let her know MRI brain looked good, no evidence of tumor, stroke, or bleed. Thanks

## 2019-12-11 NOTE — Telephone Encounter (Signed)
Schedule f/u on 7/29

## 2019-12-20 ENCOUNTER — Ambulatory Visit: Payer: Medicare PPO | Admitting: Neurology

## 2019-12-20 ENCOUNTER — Other Ambulatory Visit: Payer: Self-pay

## 2019-12-20 ENCOUNTER — Encounter: Payer: Self-pay | Admitting: Neurology

## 2019-12-20 VITALS — BP 138/77 | HR 79 | Ht 66.0 in | Wt 166.0 lb

## 2019-12-20 DIAGNOSIS — F419 Anxiety disorder, unspecified: Secondary | ICD-10-CM | POA: Diagnosis not present

## 2019-12-20 DIAGNOSIS — R41840 Attention and concentration deficit: Secondary | ICD-10-CM

## 2019-12-20 MED ORDER — SERTRALINE HCL 100 MG PO TABS: ORAL_TABLET | ORAL | 3 refills | Status: DC

## 2019-12-20 NOTE — Progress Notes (Signed)
NEUROLOGY FOLLOW UP OFFICE NOTE  Anne [name] [id number] June 06, 1940  HISTORY OF PRESENT ILLNESS: I had the pleasure of seeing Anne [name] in follow-up in the neurology clinic on 12/20/2019.  The patient was last seen on a month ago for memory changes. She is again accompanied by her daughter [name] who helps supplement the history today.  Records and images were personally reviewed where available.  I personally reviewed MRI brain done 11/2019 which did not show any acute changes, there was mild diffuse volume loss and mild chronic microvascular disease. She underwent Neuropsychological testing last month with primary weakness surrounding attention/concentration. Diagnosis of Mild Neurocognitive disorder, etiology unclear. Across ADHD questionnaire, she scored in the "likely to have ADHD" range across the inattention subscale. It was noted that cognitive issues could also be exacerbated by poor sleep, headaches, chronic pain, mild anxiety. Testing overall was not consistent with typical AD presentation.  Findings discussed with patient and daughter today. Her daughter reports she did try ADHD medication in the past but did not have good experience with it. She repeatedly says she thinks it is a lack of discipline, however her daughter disagrees. On last visit, Sertraline dose increased to 150mg  daily, which they do not feel has helped. She cannot stay on a task, house is worst than it has ever been. She endorses a lot of stress right now. She has cut down on alcohol intake.   History on Initial Assessment 10/30/2019: This is a very pleasant 79 year old right-handed retired 64 with a history of anxiety, urinary incontinence, presenting for evaluation of memory changes. She reports her memory is "terrible." Her daughter Runner, broadcasting/film/video is present to provide information, and states she started noticing changes a couple of years ago. The patient reports she has always been great with names, but now cannot  remember them, which really bothers her. She misplaces things, [name] reports that she is misplacing things constantly on a daily basis, which is frustrating. This morning she could not find her medications and was frantic. [name] had been living in [place] and would provide tech support, but noticed she would not retain information even in the same conversation. She would not remember passwords and [name] would have to repeat things. The patient is very close to her near-69 year old mother who lives in [place], she and [place] went to visit her in September. [name] has been staying with her since, and has noticed worsening symptoms from baseline. She has always been "ADD," always the person looking for things even when [name] was younger, but things have gotten worse. Following a recipe can be challenging. She does not remember how to work the October. They both deny getting lost driving, but [name] reports "it is like an internal motor going, she always has to get somewhere ASAP and prefers the most direct route," then gets distracted when driving. She denies leaving the stove on. Although she misplaces her medications, she denies missing doses. Most of her bills are on autopay, [name] has helped her be more organized with her stacks of bills coming in. She feels her daughter has made things more focused for her, but that her daughter wants things "done her way, I'm not that way, I like to do things my way." She reports feeling very overwhelmed, she stayed with her mother for 3 months, and was depressed when she came back in December. [name] has been telling her she needs to sell her house but she does not want to sell. She gets  tearful several times during the visit. She reports untidiness and not being able to garden like before. [name] has noticed an impulsivity that is different, she had a contractor show her windows for her home and "made the snap decision to buy $4500," and now [name] is in the process of undoing  the contract. She gets angry when you try to slow her down, but they are not sure if this is new. [name] feels that something happened when she came back from [place], she has noticed a "lack of awareness, like she is not there." [name] feels like she needs a Manufacturing engineer always behind her, because she forgets to complete tasks.   She denies any dizziness, diplopia, dysarthria/dysphagia, neck/back pain, focal numbness/tingling/weakness, bowel dysfunction, anosmia. She has occasional sinus headaches and blurred vision. She has been having pains in her left foot and diagnosed with plantar fasciitis. She has had worsening urinary incontinence that Myrbetriq has helped with. She has noticed slight hand tremors. She usually gets 5 hours of sleep which she attributes to difficulties with time change due to travel and her bladder. She needs naps. There is a strong family history of Alzheimer's disease in her mother and 4 maternal aunts. A maternal uncle had vascular dementia. No history of significant head injuries. She reports that every night she and her daughter go through a bottle of wine (2-1/2 glasses every night). No paranoia or hallucinations.     PAST MEDICAL HISTORY: Past Medical History:  Diagnosis Date  . Generalized anxiety disorder   . Major depressive disorder   . Osteoarthritis   . Post-nasal drip   . 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 Strep throat    06-26-2013 on amoxicillian    MEDICATIONS: Current Outpatient Medications on File Prior to Visit  Medication Sig Dispense Refill  . Cholecalciferol (VITAMIN D-3) 5000 UNITS TABS Take 1 tablet by mouth daily.    . hyoscyamine (LEVSIN SL) 0.125 MG SL tablet Place 0.125 mg under the tongue every 4 (four) hours as needed for cramping.    . Multiple Vitamin (MULTIVITAMIN WITH MINERALS) TABS tablet Take 1 tablet by mouth daily.    Marland Kitchen MYRBETRIQ 50 MG TB24 tablet     . sertraline (ZOLOFT) 100 MG tablet Take 100 mg by mouth every morning.       No current facility-administered medications on file prior to visit.    ALLERGIES: Allergies  Allergen Reactions  . Sulfa Antibiotics Nausea Only    FAMILY HISTORY: Family History  Problem Relation Age of Onset  . Breast cancer Mother 59  . Alzheimer's disease Mother   . Breast cancer Maternal Aunt 70  . Alzheimer's disease Maternal Aunt   . Breast cancer Maternal Aunt 70  . Alzheimer's disease Maternal Aunt   . Vascular Disease Maternal Uncle        Vascular dementia presentation  . Dementia Maternal Uncle     SOCIAL HISTORY: Social History   Socioeconomic History  . Marital status: Widowed    Spouse name: Not on file  . Number of children: Not on file  . Years of education: 16  . Highest education level: Bachelor's degree (e.g., BA, AB, BS)  Occupational History  . Not on file  Tobacco Use  . Smoking status: Never Smoker  . Smokeless tobacco: Never Used  Vaping Use  . Vaping Use: Never used  Substance and Sexual Activity  . Alcohol use: Yes    Alcohol/week: 14.0 standard drinks    Types: 14  Glasses of wine per week    Comment: 2 glasses of wine/night  . Drug use: No  . Sexual activity: Not on file  Other Topics Concern  . Not on file  Social History Narrative   Right handed    Lives with daughter    Has twins    Social Determinants of Health   Financial Resource Strain:   . Difficulty of Paying Living Expenses:   Food Insecurity:   . Worried About Programme researcher, broadcasting/film/video in the Last Year:   . Barista in the Last Year:   Transportation Needs:   . Freight forwarder (Medical):   Marland Kitchen Lack of Transportation (Non-Medical):   Physical Activity:   . Days of Exercise per Week:   . Minutes of Exercise per Session:   Stress:   . Feeling of Stress :   Social Connections:   . Frequency of Communication with Friends and Family:   . Frequency of Social Gatherings with Friends and Family:   . Attends Religious Services:   . Active Member of Clubs  or Organizations:   . Attends Banker Meetings:   Marland Kitchen Marital Status:   Intimate Partner Violence:   . Fear of Current or Ex-Partner:   . Emotionally Abused:   Marland Kitchen Physically Abused:   . Sexually Abused:     PHYSICAL EXAM: Vitals:   12/20/19 1512  BP: (!) 138/77  Pulse: 79  SpO2: 96%   General: No acute distress Head:  Normocephalic/atraumatic Skin/Extremities: No rash, no edema Neurological Exam: alert and oriented to person, place, and time. No aphasia or dysarthria. Fund of knowledge is appropriate.  Recent and remote memory are impaired. Attention and concentration are reduced. . Cranial nerves: Pupils equal, round. No facial asymmetry. Motor: moves all extremities symmetrically. Gait narrow-based and steady.   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 Primary weakness was in attention/concentration. Her daughter notes she tried a stimulant in the past but did not like it. If interested, discuss again with PCP. They are agreeable to increasing the Sertraline 100mg : Take 1 and 1/2 tablets daily. She was encouraged to increase physical exercise. She agrees to referral to a psychotherapist for anxiety. Repeat Neurocognitive testing will be scheduled for June 2022. Follow-up with me in 6 months, they know to call for any changes.   Thank you for allowing me to participate in her care.  Please do not hesitate to call for any questions or concerns.   July 2022, M.D.   CC: Dr. Patrcia Dolly

## 2019-12-20 NOTE — Patient Instructions (Addendum)
1. Increase Sertraline 100mg : Take 1 and 1/2 tablets daily  2. Increase regular exercise  3. Referral will be sent for psychotherapy  4. Schedule Neurocognitive testing in June 2022  5. Follow-up in 6 months, call for any changes   RECOMMENDATIONS FOR ALL PATIENTS WITH MEMORY PROBLEMS: 1. Continue to exercise (Recommend 30 minutes of walking everyday, or 3 hours every week) 2. Increase social interactions - continue going to [place] and enjoy social gatherings with friends and family 3. Eat healthy, avoid fried foods and eat more fruits and vegetables 4. Maintain adequate blood pressure, blood sugar, and blood cholesterol level. Reducing the risk of stroke and cardiovascular disease also helps promoting better memory. 5. Avoid stressful situations. Live a simple life and avoid aggravations. Organize your time and prepare for the next day in anticipation. 6. Sleep well, avoid any interruptions of sleep and avoid any distractions in the bedroom that may interfere with adequate sleep quality 7. Avoid sugar, avoid sweets as there is a strong link between excessive sugar intake, diabetes, and cognitive impairment We discussed the Mediterranean diet, which has been shown to help patients reduce the risk of progressive memory disorders and reduces cardiovascular risk. This includes eating fish, eat fruits and green leafy vegetables, nuts like almonds and hazelnuts, walnuts, and also use olive oil. Avoid fast foods and fried foods as much as possible. Avoid sweets and sugar as sugar use has been linked to worsening of memory function.

## 2020-01-30 DIAGNOSIS — N390 Urinary tract infection, site not specified: Secondary | ICD-10-CM | POA: Diagnosis not present

## 2020-01-30 DIAGNOSIS — R300 Dysuria: Secondary | ICD-10-CM | POA: Diagnosis not present

## 2020-01-31 DIAGNOSIS — Z20822 Contact with and (suspected) exposure to covid-19: Secondary | ICD-10-CM | POA: Diagnosis not present

## 2020-02-05 DIAGNOSIS — Z20822 Contact with and (suspected) exposure to covid-19: Secondary | ICD-10-CM | POA: Diagnosis not present

## 2020-02-12 DIAGNOSIS — Z20822 Contact with and (suspected) exposure to covid-19: Secondary | ICD-10-CM | POA: Diagnosis not present

## 2020-03-11 DIAGNOSIS — Z20822 Contact with and (suspected) exposure to covid-19: Secondary | ICD-10-CM | POA: Diagnosis not present

## 2020-03-19 DIAGNOSIS — E559 Vitamin D deficiency, unspecified: Secondary | ICD-10-CM | POA: Diagnosis not present

## 2020-03-19 DIAGNOSIS — K589 Irritable bowel syndrome without diarrhea: Secondary | ICD-10-CM | POA: Diagnosis not present

## 2020-03-19 DIAGNOSIS — M8580 Other specified disorders of bone density and structure, unspecified site: Secondary | ICD-10-CM | POA: Diagnosis not present

## 2020-03-19 DIAGNOSIS — F325 Major depressive disorder, single episode, in full remission: Secondary | ICD-10-CM | POA: Diagnosis not present

## 2020-03-19 DIAGNOSIS — R030 Elevated blood-pressure reading, without diagnosis of hypertension: Secondary | ICD-10-CM | POA: Diagnosis not present

## 2020-03-19 DIAGNOSIS — N3941 Urge incontinence: Secondary | ICD-10-CM | POA: Diagnosis not present

## 2020-03-19 DIAGNOSIS — F419 Anxiety disorder, unspecified: Secondary | ICD-10-CM | POA: Diagnosis not present

## 2020-03-19 DIAGNOSIS — N3281 Overactive bladder: Secondary | ICD-10-CM | POA: Diagnosis not present

## 2020-03-19 DIAGNOSIS — J301 Allergic rhinitis due to pollen: Secondary | ICD-10-CM | POA: Diagnosis not present

## 2020-06-05 DIAGNOSIS — Z20822 Contact with and (suspected) exposure to covid-19: Secondary | ICD-10-CM | POA: Diagnosis not present

## 2020-07-25 ENCOUNTER — Encounter: Payer: Self-pay | Admitting: Neurology

## 2020-07-25 ENCOUNTER — Other Ambulatory Visit: Payer: Self-pay

## 2020-07-25 ENCOUNTER — Ambulatory Visit: Payer: Medicare PPO | Admitting: Neurology

## 2020-07-25 VITALS — BP 130/72 | HR 69 | Ht 66.0 in | Wt 165.6 lb

## 2020-07-25 DIAGNOSIS — G3184 Mild cognitive impairment, so stated: Secondary | ICD-10-CM | POA: Diagnosis not present

## 2020-07-25 DIAGNOSIS — R41840 Attention and concentration deficit: Secondary | ICD-10-CM

## 2020-07-25 MED ORDER — SERTRALINE HCL 100 MG PO TABS: ORAL_TABLET | ORAL | 3 refills | Status: AC

## 2020-07-25 NOTE — Patient Instructions (Addendum)
Good to see you.  1. Refer to Behavioral health for counseling  2. Continue Sertraline 100mg : Take 1 and 1/2 tablet daily  3. Proceed with repeat Neurocognitive testing as scheduled in June  4. Discuss leg pain with PCP, hopefully you can go back to walking again  5. Follow-up in 6-8 months, call for any changes    RECOMMENDATIONS FOR ALL PATIENTS WITH MEMORY PROBLEMS: 1. Continue to exercise (Recommend 30 minutes of walking everyday, or 3 hours every week) 2. Increase social interactions - continue going to [place] and enjoy social gatherings with friends and family 3. Eat healthy, avoid fried foods and eat more fruits and vegetables 4. Maintain adequate blood pressure, blood sugar, and blood cholesterol level. Reducing the risk of stroke and cardiovascular disease also helps promoting better memory. 5. Avoid stressful situations. Live a simple life and avoid aggravations. Organize your time and prepare for the next day in anticipation. 6. Sleep well, avoid any interruptions of sleep and avoid any distractions in the bedroom that may interfere with adequate sleep quality 7. Avoid sugar, avoid sweets as there is a strong link between excessive sugar intake, diabetes, and cognitive impairment The Mediterranean diet has been shown to help patients reduce the risk of progressive memory disorders and reduces cardiovascular risk. This includes eating fish, eat fruits and green leafy vegetables, nuts like almonds and hazelnuts, walnuts, and also use olive oil. Avoid fast foods and fried foods as much as possible. Avoid sweets and sugar as sugar use has been linked to worsening of memory function.

## 2020-07-25 NOTE — Addendum Note (Signed)
Addended by: Dimas Chyle on: 07/25/2020 11:58 AM   Modules accepted: Orders

## 2020-07-25 NOTE — Progress Notes (Signed)
NEUROLOGY FOLLOW UP OFFICE NOTE  Anne [name] [id number] Jan 04, 1941  HISTORY OF PRESENT ILLNESS: I had the pleasure of seeing Anne [name] in follow-up in the neurology clinic on 78/08/2020.  The patient was last seen 78 months ago for memory changes. She is alone in the office today. Since her last visit, she states she has been fine, but her memory is definitely not good. She manages her own medications and denies missing medications. She does not have a lot of bills and denies missing payments. She got lost one time, she was driving yesterday to a friend's house she has not visited in 2 years, she found she was driving in the direction of a different friend's house and had to turn around. She felt so stupid and angry at herself. She exercises 5 days a week but has not been doing her regular walking because of leg and feet pain. She denies any headaches, dizziness, focal numbness/tingling, bowel/bladder dysfunction. Myrbetriq has helped a lot. She has cut down alcohol to 1 glass at night, last night she is happy to report she only had tea. She feels increase in Sertraline to 150mg  daily has helped her. She has not discussed her concerns with her daughter, but reports that her daughter gets very nervous and fusses over her about safety issues, for instance she does not always lock one of the front doors when her daughter thinks she should lock it. Her daughter reminds her that she is not doing the recommendations from Neuropsychological evaluation and her foot exercises. She says she is proud of herself that she does not get upset with her daughter and understands they have different personalities.    History on Initial Assessment 10/30/2019: This is a very pleasant 80 year old right-handed retired 64 with a history of anxiety, urinary incontinence, presenting for evaluation of memory changes. She reports her memory is "terrible." Her daughter Runner, broadcasting/film/video is present to provide information, and states  she started noticing changes a couple of years ago. The patient reports she has always been great with names, but now cannot remember them, which really bothers her. She misplaces things, [name] reports that she is misplacing things constantly on a daily basis, which is frustrating. This morning she could not find her medications and was frantic. [name] had been living in [place] and would provide tech support, but noticed she would not retain information even in the same conversation. She would not remember passwords and [name] would have to repeat things. The patient is very close to her near-62 year old mother who lives in [place], she and [place] went to visit her in September. [name] has been staying with her since, and has noticed worsening symptoms from baseline. She has always been "ADD," always the person looking for things even when [name] was younger, but things have gotten worse. Following a recipe can be challenging. She does not remember how to work the October. They both deny getting lost driving, but [name] reports "it is like an internal motor going, she always has to get somewhere ASAP and prefers the most direct route," then gets distracted when driving. She denies leaving the stove on. Although she misplaces her medications, she denies missing doses. Most of her bills are on autopay, [name] has helped her be more organized with her stacks of bills coming in. She feels her daughter has made things more focused for her, but that her daughter wants things "done her way, I'm not that way, I like to do things  my way." She reports feeling very overwhelmed, she stayed with her mother for 3 months, and was depressed when she came back in December. [name] has been telling her she needs to sell her house but she does not want to sell. She gets tearful several times during the visit. She reports untidiness and not being able to garden like before. [name] has noticed an impulsivity that is different, she had a  contractor show her windows for her home and "made the snap decision to buy $4500," and now [name] is in the process of undoing the contract. She gets angry when you try to slow her down, but they are not sure if this is new. [name] feels that something happened when she came back from [place], she has noticed a "lack of awareness, like she is not there." [name] feels like she needs a Manufacturing engineer always behind her, because she forgets to complete tasks.   She denies any dizziness, diplopia, dysarthria/dysphagia, neck/back pain, focal numbness/tingling/weakness, bowel dysfunction, anosmia. She has occasional sinus headaches and blurred vision. She has been having pains in her left foot and diagnosed with plantar fasciitis. She has had worsening urinary incontinence that Myrbetriq has helped with. She has noticed slight hand tremors. She usually gets 5 hours of sleep which she attributes to difficulties with time change due to travel and her bladder. She needs naps. There is a strong family history of Alzheimer's disease in her mother and 4 maternal aunts. A maternal uncle had vascular dementia. No history of significant head injuries. She reports that every night she and her daughter go through a bottle of wine (2-1/2 glasses every night). No paranoia or hallucinations.   Diagnostic Data: MRI brain done 11/2019 did not show any acute changes, there was mild diffuse volume loss and mild chronic microvascular disease.  Neuropsychological testing in 10/2019 showed primary weakness surrounding attention/concentration. Diagnosis of Mild Neurocognitive disorder, etiology unclear. Across ADHD questionnaire, she scored in the "likely to have ADHD" range across the inattention subscale. It was noted that cognitive issues could also be exacerbated by poor sleep, headaches, chronic pain, mild anxiety. Testing overall was not consistent with typical AD presentation.   PAST MEDICAL HISTORY: Past Medical History:   Diagnosis Date  . Generalized anxiety disorder   . Major depressive disorder   . Osteoarthritis   . Post-nasal drip   . 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 Strep throat    06-26-2013 on amoxicillian    MEDICATIONS: Current Outpatient Medications on File Prior to Visit  Medication Sig Dispense Refill  . calcium carbonate (OS-CAL - DOSED IN MG OF ELEMENTAL CALCIUM) 1250 (500 Ca) MG tablet Take 1 tablet by mouth.    . Cholecalciferol (VITAMIN D-3) 5000 UNITS TABS Take 1 tablet by mouth daily.    . hyoscyamine (LEVSIN SL) 0.125 MG SL tablet Place 0.125 mg under the tongue every 4 (four) hours as needed for cramping.    . Multiple Vitamin (MULTIVITAMIN WITH MINERALS) TABS tablet Take 1 tablet by mouth daily.    Marland Kitchen MYRBETRIQ 50 MG TB24 tablet     . sertraline (ZOLOFT) 100 MG tablet Take 1 and 1/2 tablet daily 135 tablet 3   No current facility-administered medications on file prior to visit.    ALLERGIES: Allergies  Allergen Reactions  . Sulfa Antibiotics Nausea Only    FAMILY HISTORY: Family History  Problem Relation Age of Onset  . Breast cancer Mother 84  . Alzheimer's disease Mother   . Breast  cancer Maternal Aunt 40  . Alzheimer's disease Maternal Aunt   . Breast cancer Maternal Aunt 40  . Alzheimer's disease Maternal Aunt   . Vascular Disease Maternal Uncle        Vascular dementia presentation  . Dementia Maternal Uncle     SOCIAL HISTORY: Social History   Socioeconomic History  . Marital status: Widowed    Spouse name: Not on file  . Number of children: Not on file  . Years of education: 52  . Highest education level: Bachelor's degree (e.g., BA, AB, BS)  Occupational History  . Not on file  Tobacco Use  . Smoking status: Never Smoker  . Smokeless tobacco: Never Used  Vaping Use  . Vaping Use: Never used  Substance and Sexual Activity  . Alcohol use: Yes    Alcohol/week: 14.0 standard drinks    Types: 14 Glasses of wine per week    Comment: 2 glasses of  wine/night  . Drug use: No  . Sexual activity: Not on file  Other Topics Concern  . Not on file  Social History Narrative   Right handed    Lives with daughter    Has twins    Social Determinants of Health   Financial Resource Strain: Not on file  Food Insecurity: Not on file  Transportation Needs: Not on file  Physical Activity: Not on file  Stress: Not on file  Social Connections: Not on file  Intimate Partner Violence: Not on file     PHYSICAL EXAM: Vitals:   07/25/20 0820  BP: 130/72  Pulse: 69  SpO2: 97%   General: No acute distress Head:  Normocephalic/atraumatic Skin/Extremities: No rash, no edema Neurological Exam: alert and awake. No aphasia or dysarthria. Patient is verbose about her concerns about her daughter. Fund of knowledge is appropriate. Attention and concentration are reduced.   Cranial nerves: Pupils equal, round. Extraocular movements intact with no nystagmus. Visual fields full.  No facial asymmetry.  Motor: Bulk and tone normal, muscle strength 5/5 throughout with no pronator drift.   Finger to nose testing intact.  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 Primary weakness was in attention/concentration. She continues to report that her memory is definitely not good, her daughter is not present today however throughout the visit the patient expressed concerns about their home situation, she is agreeable to seeing a counselor for anxiety/navigating family relationships. Continue Sertraline 150mg  daily. Proceed with scheduled repeat Neuropsychological evaluation in June 2022 to assess trajectory. Discuss leg pain with PCP. Follow-up in 6-8 months, she knows to call for any changes.   Thank you for allowing me to participate in her care.  Please do not  hesitate to call for any questions or concerns.   July 2022, M.D.   CC: Dr. Patrcia Dolly

## 2020-08-04 DIAGNOSIS — Z961 Presence of intraocular lens: Secondary | ICD-10-CM | POA: Diagnosis not present

## 2020-08-04 DIAGNOSIS — H04123 Dry eye syndrome of bilateral lacrimal glands: Secondary | ICD-10-CM | POA: Diagnosis not present

## 2020-08-04 DIAGNOSIS — H0102A Squamous blepharitis right eye, upper and lower eyelids: Secondary | ICD-10-CM | POA: Diagnosis not present

## 2020-08-04 DIAGNOSIS — H353131 Nonexudative age-related macular degeneration, bilateral, early dry stage: Secondary | ICD-10-CM | POA: Diagnosis not present

## 2020-08-04 DIAGNOSIS — H40013 Open angle with borderline findings, low risk, bilateral: Secondary | ICD-10-CM | POA: Diagnosis not present

## 2020-08-04 DIAGNOSIS — H0102B Squamous blepharitis left eye, upper and lower eyelids: Secondary | ICD-10-CM | POA: Diagnosis not present

## 2020-08-04 DIAGNOSIS — H1045 Other chronic allergic conjunctivitis: Secondary | ICD-10-CM | POA: Diagnosis not present

## 2020-08-04 DIAGNOSIS — H18513 Endothelial corneal dystrophy, bilateral: Secondary | ICD-10-CM | POA: Diagnosis not present

## 2020-08-25 ENCOUNTER — Ambulatory Visit: Payer: Medicare PPO | Admitting: Psychology

## 2020-09-08 ENCOUNTER — Ambulatory Visit: Payer: Medicare PPO | Admitting: Psychology

## 2020-09-08 ENCOUNTER — Other Ambulatory Visit: Payer: Self-pay

## 2020-09-09 DIAGNOSIS — Z0000 Encounter for general adult medical examination without abnormal findings: Secondary | ICD-10-CM | POA: Diagnosis not present

## 2020-09-09 DIAGNOSIS — J301 Allergic rhinitis due to pollen: Secondary | ICD-10-CM | POA: Diagnosis not present

## 2020-09-09 DIAGNOSIS — K589 Irritable bowel syndrome without diarrhea: Secondary | ICD-10-CM | POA: Diagnosis not present

## 2020-09-09 DIAGNOSIS — F3342 Major depressive disorder, recurrent, in full remission: Secondary | ICD-10-CM | POA: Diagnosis not present

## 2020-09-09 DIAGNOSIS — M85851 Other specified disorders of bone density and structure, right thigh: Secondary | ICD-10-CM | POA: Diagnosis not present

## 2020-09-09 DIAGNOSIS — N3941 Urge incontinence: Secondary | ICD-10-CM | POA: Diagnosis not present

## 2020-09-09 DIAGNOSIS — M722 Plantar fascial fibromatosis: Secondary | ICD-10-CM | POA: Diagnosis not present

## 2020-09-19 ENCOUNTER — Other Ambulatory Visit: Payer: Self-pay | Admitting: Family Medicine

## 2020-09-19 DIAGNOSIS — Z1231 Encounter for screening mammogram for malignant neoplasm of breast: Secondary | ICD-10-CM

## 2020-09-23 ENCOUNTER — Other Ambulatory Visit: Payer: Self-pay

## 2020-09-23 ENCOUNTER — Ambulatory Visit (INDEPENDENT_AMBULATORY_CARE_PROVIDER_SITE_OTHER): Payer: Medicare PPO | Admitting: Psychology

## 2020-09-23 DIAGNOSIS — F908 Attention-deficit hyperactivity disorder, other type: Secondary | ICD-10-CM

## 2020-09-23 DIAGNOSIS — F341 Dysthymic disorder: Secondary | ICD-10-CM | POA: Diagnosis not present

## 2020-09-23 DIAGNOSIS — R419 Unspecified symptoms and signs involving cognitive functions and awareness: Secondary | ICD-10-CM | POA: Diagnosis not present

## 2020-09-29 DIAGNOSIS — M79652 Pain in left thigh: Secondary | ICD-10-CM | POA: Diagnosis not present

## 2020-10-11 ENCOUNTER — Ambulatory Visit
Admission: RE | Admit: 2020-10-11 | Discharge: 2020-10-11 | Disposition: A | Discharge status: Home / Self Care | Payer: Medicare PPO | Source: Ambulatory Visit | Attending: Family Medicine | Admitting: Family Medicine

## 2020-10-11 ENCOUNTER — Other Ambulatory Visit: Payer: Self-pay

## 2020-10-11 DIAGNOSIS — Z1231 Encounter for screening mammogram for malignant neoplasm of breast: Secondary | ICD-10-CM

## 2020-10-13 ENCOUNTER — Ambulatory Visit: Payer: Medicare PPO

## 2020-10-14 ENCOUNTER — Ambulatory Visit: Payer: Medicare PPO | Admitting: Sports Medicine

## 2020-10-14 ENCOUNTER — Encounter: Payer: Self-pay | Admitting: Sports Medicine

## 2020-10-14 ENCOUNTER — Other Ambulatory Visit: Payer: Self-pay

## 2020-10-14 VITALS — BP 122/76 | Ht 66.0 in | Wt 160.0 lb

## 2020-10-14 DIAGNOSIS — M19079 Primary osteoarthritis, unspecified ankle and foot: Secondary | ICD-10-CM

## 2020-10-14 DIAGNOSIS — M2141 Flat foot [pes planus] (acquired), right foot: Secondary | ICD-10-CM | POA: Diagnosis not present

## 2020-10-14 DIAGNOSIS — M722 Plantar fascial fibromatosis: Secondary | ICD-10-CM

## 2020-10-14 DIAGNOSIS — M2142 Flat foot [pes planus] (acquired), left foot: Secondary | ICD-10-CM

## 2020-10-14 DIAGNOSIS — R269 Unspecified abnormalities of gait and mobility: Secondary | ICD-10-CM

## 2020-10-14 DIAGNOSIS — M2170 Unequal limb length (acquired), unspecified site: Secondary | ICD-10-CM | POA: Diagnosis not present

## 2020-10-14 DIAGNOSIS — Z96642 Presence of left artificial hip joint: Secondary | ICD-10-CM | POA: Diagnosis not present

## 2020-10-14 HISTORY — DX: Unspecified abnormalities of gait and mobility: R26.9

## 2020-10-14 HISTORY — DX: Primary osteoarthritis, unspecified ankle and foot: M19.079

## 2020-10-14 NOTE — Patient Instructions (Addendum)
Thank you for coming in to see [place] today! Please see below to review our plan for today's visit:   1.   Please trial the insoles that were made for you and your shoes for the next 3 to 4 weeks. 2.   Please consider going to [employer] or [employer], and asked them to fit you for a neutral cushioned shoe to be used with orthotics.  Please bring your insoles with you to trial in the shoes. 3.   Please start doing the hip exercises that were shown to you today daily.  Lets plan to follow-up in 3 to 4 weeks to make your custom orthotics.   Please call the clinic at [phone] if your symptoms worsen or you have any concerns. It was our pleasure to serve you.       Dr. Guy Sandifer Dr. Roanna Epley [facility]

## 2020-10-14 NOTE — Assessment & Plan Note (Signed)
Leg length inequality and hip OA contribute We used sports insoles with lift, scaphoid and wedge Improved gait after Plan custom orthotics if this helps

## 2020-10-14 NOTE — Progress Notes (Signed)
PCP: Gweneth Dimitri, MD  Subjective:   HPI: Patient is a 80 y.o. 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  She reports that since her hip replacement surgery, she is felt that her gait has changed.  Several years ago, she developed left heel pain which a previous orthopedist diagnosed as plantar fasciitis.  She has been wearing more cushioned comfortable shoes which she feels has been helpful but she continues to have pain.  She is also noticed that her feet seem to have flattened over the past several years.  She is wondering if there is anything that can be done to help with this.  No new or sudden worsening of symptoms, no trauma or injury.  In terms of her hips, patient reports that she has had progressively worsening lateral and posterior hip pain.  It feels somewhat similar to her hip osteoarthritis prior to her surgery in her left.  Currently, her right side is worse than her left.  She is very active and does water aerobics for about 5 hours/week, and also does a lot of gardening.  She notices the hip pain mostly when she is resting and trying to sleep at night and describes as an aching pain.  She is not sure if there are any certain activities that seem to aggravate her pain.  The pain is in her posterior glutes, radiates around the side of her hip, but she does not notice much radiation down her leg.  She has been told she has arthritis in her lumbar spine in the past.  She denies any numbness, tingling or weakness in her lower extremities.  No bowel or bladder incontinence, no saddle anesthesia.  Review of Systems:  Per HPI.   PMFSH, medications and smoking status reviewed.      Objective:  Physical Exam:  No flowsheet data found.  BP 122/76   Ht 5\' 6"  (1.676 m)   Wt 160 lb (72.6 kg)   BMI 25.82 kg/m     Gen: awake, alert, NAD, comfortable in exam room Pulm: breathing unlabored  Lumbar spine/hips: -Inspection: No obvious erythema, edema or ecchymoses.  She has normal lumbar lordosis. -Palpation: Mildly tender palpation diffusely in the gluteal musculature, very mildly tender over greater trochanter bilaterally.  Nontender along the midline of the lumbar spine. On standing there is thoracolumbar scolisosis concave to RT -ROM: Full range of motion of the lumbar spine without significant pain.  Limited IR bilaterally of the hips, most significant on the right with reproduction of lateral hip pain with IR.  Somewhat limited ER bilaterally as well without pain. -Strength: 5/5 strength with hip abduction bilaterally, perhaps a bit weaker on the right.  Otherwise normal strength of the hip and lumbar spine. -Special: Positive straight leg raise on the right.  Approximately 1 cm leg length discrepancy, shorter on left.  Bilateral feet: -Inspection: With standing, patient has significant collapse of the longitudinal and transverse arches bilaterally.  She has subsequent hammering of the second and third toes bilaterally.  Significant TMT bossing of the first TMT joint on the left.   -Gait: Significant longitudinal arch collapse to near pes planus.  She also has significant calcaneal valgus bilaterally.  Antalgic gait with significant left hip drop. -Palpation: Mildly tender to palpation at the medial plantar fascial insertion on the left foot -ROM:  Full range of motion of the ankle bilaterally, some restriction of the range of motion at the TMT joint on the left, forage motion of the toes without significant restriction -Strength: 5/5 strength in bilateral feet and toes -Neurovascular intact in bilateral lower extremities Assessment & Plan:  1.  Left foot plantar fasciitis 2.  Bilateral foot pes planus 3.  Leg length discrepancy 4.  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  It appears based on her TMT bossing that she may have previously had a significantly cavus arch which is now almost completely collapsed with significant navicular drop.  Suspect that her plantar fasciitis symptoms are secondary to this.  It is also possible that her leg length discrepancy has contributed as well.  Plan: -Custom insoles made today (green insoles with heel wedge on the left and scaphoid pad bilaterally).  Patient reported improvement in comfort with them in. -Hip abduction strengthening and balance exercises given today -Plan to follow-up in 3 to 4 weeks to reevaluate and likely will make custom orthotics at that time, will likely need new shoes.   Guy Sandifer, MD [facility] Fellow 10/14/2020 9:33 AM  I observed and examined the patient with the [facility] resident and agree with assessment and plan.  Note reviewed and modified by me. Sterling Big, MD

## 2020-11-05 ENCOUNTER — Ambulatory Visit (INDEPENDENT_AMBULATORY_CARE_PROVIDER_SITE_OTHER): Payer: Medicare PPO | Admitting: Psychology

## 2020-11-05 ENCOUNTER — Encounter: Payer: Self-pay | Admitting: Psychology

## 2020-11-05 ENCOUNTER — Ambulatory Visit: Payer: Medicare PPO

## 2020-11-05 ENCOUNTER — Other Ambulatory Visit: Payer: Self-pay

## 2020-11-05 DIAGNOSIS — R41840 Attention and concentration deficit: Secondary | ICD-10-CM | POA: Diagnosis not present

## 2020-11-05 DIAGNOSIS — M722 Plantar fascial fibromatosis: Secondary | ICD-10-CM | POA: Insufficient documentation

## 2020-11-05 DIAGNOSIS — F325 Major depressive disorder, single episode, in full remission: Secondary | ICD-10-CM | POA: Diagnosis not present

## 2020-11-05 DIAGNOSIS — F411 Generalized anxiety disorder: Secondary | ICD-10-CM | POA: Diagnosis not present

## 2020-11-05 DIAGNOSIS — R4189 Other symptoms and signs involving cognitive functions and awareness: Secondary | ICD-10-CM

## 2020-11-05 DIAGNOSIS — M1990 Unspecified osteoarthritis, unspecified site: Secondary | ICD-10-CM | POA: Insufficient documentation

## 2020-11-05 NOTE — Progress Notes (Signed)
NEUROPSYCHOLOGICAL EVALUATION [place] [place] [facility] of Neurology  Date of Evaluation: November 05, 2020  Reason for Referral:   Anne [name] is a 80 y.o. right-handed Caucasian female referred by Anne [name], M.D., to characterize her current cognitive functioning and assist with diagnostic clarity and treatment planning in the context of prior diagnosis of a mild neurocognitive disorder, longstanding traits of inattention/distractibility, and a family history of Alzheimer's disease.   Assessment and Plan:   Clinical Impression(s): Anne [name] pattern of performance is suggestive of neuropsychological functioning largely within normal limits relative to age-matched peers. A relative weakness was exhibited across attention/concentration; however, performances remained in the below average range. Mild variability was noted across processing speed, likely related to some trouble with visual scanning across lower performing tasks. Performance was overall appropriate across executive functioning, receptive and expressive language, visuospatial abilities, and learning and memory. Anne [name] denied difficulties completing instrumental activities of daily living (ADLs) independently.  Relative to her prior evaluation in June 2021, there were no areas which exhibited significant cognitive decline and actually were a few areas which showed modest improvements. These included receptive language and recognition/consolidation aspects of verbal memory. All other domains exhibited relative stability. While Anne [name] previously was diagnosed with a mild neurocognitive disorder, improvements in previous domains which showed some weaknesses, coupled with her overall intact cognitive profile, renders this diagnosis inappropriate at the present time in my opinion.   The etiology for experienced cognitive dysfunction in day-to-day life is likely related to a combination of factors.  While Anne [name] may not meet formal diagnostic criteria for ADHD, she certainly has traits of this condition surrounding inattention, distractibility, and disorganization which have been present throughout her entire life. This was true during her previous evaluation and has remained to present day. In addition to this, she further described several ongoing stressors, including acute conflicts with her daughter, the declining health of her mother, and anxiety surrounding an upcoming visit with her sister whom she has a somewhat poor relationship with. Acute pain exacerbations and resultant sleep disruption would also exacerbate cognitive difficulties. Overall, it is likely a combination of these factors which creates Anne [name] day-to-day experience. Based upon current testing, I do not see anything which has me concerned for the presence of Alzheimer's disease or a neurodegenerative illness at the present time. This is bolstered by stable or mildly improved memory performances across testing. Continued medical monitoring will be important moving forward.   Diagnosis: Attention or concentration deficit (R41.84)  Recommendations: A repeat neuropsychological evaluation in the future could certainly be considered. However, as long as Anne [name] and her family are not noting significant functional decline, I do not see the need for an additional evaluation prior to 24 months from the current date.  It seems that sleep disturbances are acutely related to ongoing pain. She is encouraged to continue adhering to recommendations made by her Sports Medicine clinician to address these concerns. If desired, she could also speak to her PCP surrounding medication-based intervention to assist with falling and staying asleep throughout the night.   If desired, she could also discuss the pros and cons of stimulant medication with her PCP to address ongoing difficulties with sustained attention and distractibility.   Anne [name]  is encouraged to attend to lifestyle factors for brain health (e.g., regular physical exercise, good nutrition habits, regular participation in cognitively-stimulating activities, and general stress management techniques), which are likely to have benefits for both emotional adjustment and  cognition. In fact, in addition to promoting good general health, regular exercise incorporating aerobic activities (e.g., brisk walking, jogging, cycling, etc.) has been demonstrated to be a very effective treatment for depression and stress, with similar efficacy rates to both antidepressant medication and psychotherapy.  Optimal control of vascular risk factors (including safe cardiovascular exercise and adherence to dietary recommendations) is encouraged. It is excellent that she has decreased alcohol intake over the past year and I would strongly encourage her to continue to do so. She could also review the MIND Diet as there is some research to support this diet surrounding overall brain health.  As stated during her previous evaluation, should impulsivity be present, it will be important for Anne [name] to have another person with her when in situations where she may need to process information, weigh the pros and cons of different options, and make decisions.  When learning new information, she would benefit from information being broken up into small, manageable pieces. She may also find it helpful to articulate the material in her own words and in a context to promote encoding at the onset of a new task. This material may need to be repeated multiple times to promote encoding.  Memory can be improved using internal strategies such as rehearsal, repetition, chunking, mnemonics, association, and imagery. External strategies such as written notes in a consistently used memory journal, visual and nonverbal auditory cues such as a calendar on the refrigerator or appointments with alarm, such as on a cell phone, can also  help maximize recall.    To address problems with fluctuating attention, she may wish to consider:   -Avoiding external distractions when needing to concentrate   -Limiting exposure to fast paced environments with multiple sensory demands   -Writing down complicated information and using checklists   -Attempting and completing one task at a time (i.e., no multi-tasking)   -Verbalizing aloud each step of a task to maintain focus   -Taking frequent breaks during the completion of steps/tasks to avoid fatigue   -Reducing the amount of information considered at one time  Review of Records:   Ms. Sherley was seen by [facility] Neu[facility] Marland KitchenEllouise [name], M.D.) on 10/30/2019 for an evaluation of memory loss. Briefly, she described her memory as "terrible." Her daughter Hazle Quant reported noticing changes in her mother a couple of years prior. Ms. Winkles reported always being great with names but now cannot remember them. She also has a tendency to misplace things. [name] reported that her mother seems to be misplacing things very regularly. [name] had been living in [place] and would provide tech support (e.g., assistance with technical issues like online bill paying or how to work her television or smartphone), but noticed that her mother would not retain information even in the same conversation. [name] noted that her mother has always been "ADD" but things have seemingly gotten worse. Following a recipe can also be challenging. They both denied getting lost driving. However, [name] reported "it is like an internal motor going, she always has to get somewhere ASAP and prefers the most direct route," then gets distracted when driving. Although she misplaces her medications, Ms. Vanvleck denied missing doses. Most of her bills are on autopay and [name] has helped her be more organized with her stacks of bills come in. Ms. Dikes reported feeling very overwhelmed. [name] had been telling her she needs to sell her house but she did  not want to sell. She reported untidiness and not being able to garden  like before. [name] has noticed an impulsivity that seems different. For example, her mother had a contractor show her windows for her home and "made the snap decision to buy $4500." [name] was in the process of undoing the contract during the previous interview.    Ms. Lindenberger denied any dizziness, diplopia, dysarthria/dysphagia, neck/back pain, focal numbness/tingling/weakness, bowel dysfunction, or anosmia. She has occasional sinus headaches and blurred vision. She has been having pains in her left foot and was diagnosed with plantar fasciitis. She has had worsening urinary incontinence that Myrbetriq has helped with. She has noticed slight hand tremors. She usually gets 5 hours of sleep which she attributes to difficulties with time change due to travel and her bladder. There is a strong family history of Alzheimer's disease in her mother and 4 maternal aunts. A maternal uncle had vascular dementia. She denied a history of significant head injuries. She reported that every night she and her daughter go through a bottle of wine (2-1/2 glasses every night) at that time. Paranoia or hallucinations were denied. Performance on a brief cognitive screening instrument (SLUMS) was 27/30.  She completed a comprehensive neuropsychological evaluation with myself on 11/01/2019. Results suggested primary weaknesses surrounding attention/concentration; this also impacted receptive language as greater difficulties were noted as verbal working memory demands increased. Additional performance variability was exhibited across processing speed, executive functioning, and encoding (i.e., learning) aspects of memory. Recognition trials across verbal memory measures were unexpectedly poor despite average scores across delayed recall measures and strong overall retention percentages. Performance was appropriate across expressive language and visuospatial abilities.  She was ultimately diagnosed with a mild neurocognitive disorder. The etiology was unclear at that time. However, there were concerns of underlying traits of ADHD being present at the very least. Repeat testing in 18-24 months was recommended.   Most recently, Ms. Albino was seen by Dr. Delice Lesch for follow-up on 07/25/2020. Since her last visit, she reported feeling fine overall. She has continued to manage medications independently. She does not have a lot of bills and denied missing payments. She exercises five days a week but has been dealing with ongoing hip and foot pain. She denied headaches, dizziness, focal numbness/tingling, or bowel/bladder dysfunction. She also reported cutting down alcohol to one glass at night. Ultimately, Ms. Goens was referred for a comprehensive neuropsychological evaluation to characterize her cognitive abilities and to assist with diagnostic clarity and treatment planning.   Ms. Sukhu was also seen by Sports Medicine Stefanie Libel, M.D.) on 10/14/2020 to evaluate ongoing hip pain. Dr. Oneida Alar noted that there was a leg length inequality and that this and osteoarthritis in her hip contribute to ongoing pain symptoms and sporadic balance instability.  Brain MRI on 12/01/2019 was unremarkable with less than typical white matter disease observed.   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 (Patient not taking: Reported on 10/14/2020), Disp: , Rfl:    Cholecalciferol (VITAMIN D-3) 5000 UNITS TABS, Take 1 tablet by mouth daily. (Patient not taking: Reported on 10/14/2020), Disp: , Rfl:    hyoscyamine (LEVSIN SL) 0.125 MG SL tablet, Place 0.125 mg under the tongue every 4 (four) hours as needed for cramping. (Patient not taking: Reported on 10/14/2020), Disp: , Rfl:    Multiple Vitamin (MULTIVITAMIN WITH MINERALS) TABS tablet, Take 1 tablet by mouth daily. (Patient not taking: Reported on 10/14/2020), Disp: , Rfl:    MYRBETRIQ 50 MG TB24 tablet, , Disp: , Rfl:    sertraline (ZOLOFT) 100 MG tablet, Take 1 and 1/2 tablet daily, Disp: 135 tablet, Rfl: 3  Clinical Interview:   The following information was obtained during a clinical interview with Ms. Kochel during her initial evaluation on 11/01/2019. Sections have been updated where appropriate.   Cognitive Symptoms: Decreased short-term memory: Endorsed. Ms. Huyett previously described primary difficulties surrounding the misplacing of objects and trouble recalling names of familiar individuals. Her daughter who was present during the initial interview expressed concerns that Ms. Hauth does not retain information well during conversations and that she seems far more easily confused than what is typical. Currently, Ms. Loving noted that memory concerns have seemed stable and not noticeably worse since previous testing. Overall, cognitive deficits were reported to be observable for the past 3-4 years, with some prior mention of a potentially progressive  component.  Decreased long-term memory: Denied. Decreased attention/concentration: Endorsed. She previously reported increased distractibility, noting that after she gets distracted, she will forgot what she was originally doing. Her daughter had described longstanding trouble with organization and described her mother as potentially having undiagnosed ADHD. She also noted that her mother appears to have an over-active mind in that she constantly has thoughts running through her mind which are likely distracting and limit her ability to focus on what is going on around her. These difficulties were said to have remained consistent. Ms. Mille noted that she met with a clinician for formal ADHD testing several months ago. I was unable to locate these records. However, she stated that while she did not receive a formal diagnosis, this evaluation did reveal "serious organizational deficits," consistent with results of her prior neuropsychological evaluation with myself.  Reduced processing speed: Endorsed. Difficulties with executive functions: Endorsed. In addition to trouble with organization, her daughter previously described several instances of increased impulsivity. As mentioned above, Ms. Hakanson had impulsively signed a contract to have new windows installed on the spot rather than allowing herself time to think and consider her options. Her daughter also described an instance where she seemed ready and willing to hand over her credit card to pay for a large sum of auto repairs, some of which might not have been necessary. Overall, her daughter noted that her mother seems very confident in her decision making at the time, but then will second guess herself shortly afterwards, often after it is too late to make any changes. This was said  to have remained stable.  Difficulties with emotion regulation: Denied. Difficulties with receptive language: Denied assuming she can hear the source of the sound adequately.   Difficulties with word finding: Endorsed. She also reported a subjective decline in her vocabulary while conversing with others. This was said to have remained stable.  Decreased visuoperceptual ability: Denied.   Difficulties completing ADLs: Somewhat. She currently lives with her daughter. She previously reported managing her medications independently, noting infrequent instances where she may forget to take her morning doses. Her daughter has had to assist with organizing bill paying pursuits and financial management in the past. Many of her bills are on auto-pay. Driving related difficulties were denied. No notable changes in daily functioning were reported since prior testing.    Additional Medical History: History of traumatic brain injury/concussion: Denied. History of stroke: Denied. History of seizure activity: Denied. History of known exposure to toxins: Denied. Symptoms of chronic pain: Endorsed. She previously reported being recently diagnosed with plantar fasciitis, which had led to some chronic foot pain. She also reported sporadic hip pain despite undergoing a hip replacement procedure approximately 7 years prior. Currently, she reported the continued effects of plantar fasciitis. She also described a recent exacerbation of hip pain. However, she did reveal that she has met with a Sports Medicine physician who has been helping her with these symptoms.  Experience of frequent headaches/migraines: Denied. She previously reported tension headaches occurring a couple times per week. These were largely attributed to stress/anxiety. Currently, she denied ongoing headache-related concerns.  Frequent instances of dizziness/vertigo: Denied.   Sensory changes: She wears glasses with positive effect and acknowledged ongoing mild hearing loss. She denied using hearing aids and reported her last audiology exam as likely occurring many years prior. Other sensory changes/difficulties (e.g., taste or  smell) were denied.  Balance/coordination difficulties: Endorsed. She previously reported her balance "not being the same" since undergoing her hip replacement procedure 7 years prior. However, she denied recent falls and engages in water aerobics regularly to help strengthen her body. This was said to be stable. She also added that she recently learned that her left leg was 0.5 inches shorter than her right, likely a result of prior hip replacement surgery. This was believed to be impacting balance. Other motor difficulties: Endorsed. She previously acknowledged a subtle tremor in her right hand. Her daughter reported observing this tremor more readily lately, noting that it seems to occur when she is using her hands and can occur bilaterally. No acute changes were noted, nor noticed during interview or testing.    Sleep History: Estimated hours obtained each night: 5 hours.  Difficulties falling asleep: Endorsed "occasionally."  Difficulties staying asleep: Endorsed. These were previously related to bladder difficulties and needing to use the restroom frequently throughout the night. Currently, she noted ongoing hip pain and discomfort as an additional cause for sleep disruptions.  Feels rested and refreshed upon awakening: Denied. Lately, she reported feeling fatigued upon awakening.    History of snoring: Endorsed. History of waking up gasping for air: Denied. Witnessed breath cessation while asleep: Denied.   History of vivid dreaming: Denied. Excessive movement while asleep: Denied. Instances of acting out her dreams: Denied. She did acknowledge very infrequent instances where she has jumped out of bed while asleep. However, these were not said to occur with any regularity.    Psychiatric/Behavioral Health History: Depression: Denied. She previously described her mood as "up and down" and acknowledged a history of depression with medication intervention in  the past. Medication efforts were  said to be helpful. Acutely, she described her mood as positive outside of some family-related stressors involving conflict between her and her daughter. Current or remote suicidal ideation, intent, or plan were denied.  Anxiety: Endorsed. Symptoms of anxiety were largely generalized in nature. However, she did allude to some family distress surrounding her mother's health and a poor relationship with her sister which likely causes additional stress.  Mania: Denied. Trauma History: Denied. Visual/auditory hallucinations: Denied. Delusional thoughts: Denied.   Tobacco: Denied. Alcohol: She previously reported consuming 2-2.5 glasses of wine per night. However, this was said to have decreased over the past year to where she will consume one glass per night and sometimes none at all. A history of problematic alcohol abuse or dependence was denied.  Recreational drugs: Denied. Caffeine: Denied.   Family History: Problem Relation Age of Onset   Breast cancer Mother 77   Alzheimer's disease Mother    Breast cancer Maternal Aunt 47   Alzheimer's disease Maternal Aunt    Breast cancer Maternal Aunt 40   Alzheimer's disease Maternal Aunt    Vascular Disease Maternal Uncle        Vascular dementia presentation   Dementia Maternal Uncle    This information was confirmed by Ms. Marylyn Ishihara.  Academic/Vocational History: Highest level of educational attainment: 16 years. She was born in [place] and moved to the [place] at around the age of 30. She completed high school and earned a Dietitian in teaching from the [employer]. She described herself as a strong (A/B) student in academic settings. No relative weaknesses were identified.  History of developmental delay: Denied. History of grade repetition: Denied. Enrollment in special education courses: Denied. History of LD/ADHD: Denied.   Employment: Retired. She previously worked as a Pharmacist, hospital for many years.   Evaluation  Results:   Behavioral Observations: Ms. Leder was unaccompanied, arrived to her appointment on time, and was appropriately dressed and groomed. She appeared alert and oriented. Observed gait and station were within normal limits. Gross motor functioning appeared intact upon informal observation and no abnormal movements (e.g., tremors) were noted. Her affect was relaxed and positive during interview. Spontaneous speech was fluent and word finding difficulties were not observed during interview. Thought processes were coherent, organized, and normal in content. Insight into her cognitive difficulties appeared adequate.   During testing, sustained attention was appropriate. Task engagement was adequate and she persisted when challenged. She did exhibit some trouble with visual scanning across a visuomotor sequencing task (TMT A & B) which slowed overall completion time. Additionally, while completing mood-related questionnaires, she became tearful and began crying while filling out a depression-related questionnaire. She stated that this was due to stress regarding an upcoming trip to visit her mother and sister. Despite this brief bout of crying, she responded minimally across this instrument. Overall, Ms. Oberman was cooperative with the clinical interview and subsequent testing procedures.   Adequacy of Effort: The validity of neuropsychological testing is limited by the extent to which the individual being tested may be assumed to have exerted adequate effort during testing. Ms. Nolet expressed her intention to perform to the best of her abilities and exhibited adequate task engagement and persistence. Scores across stand-alone and embedded performance validity measures were within expectation. As such, the results of the current evaluation are believed to be a valid representation of Ms. Stenseth current cognitive functioning.  Test Results: Ms. Detamore was fully oriented at the time  of the current  evaluation.  Intellectual abilities based upon educational and vocational attainment were estimated to be in the average to above average range. Premorbid abilities were estimated to be within the above average range based upon a single-word reading test, consistent with her prior evaluation.   Processing speed was mildly variable, ranging from the below average to above average normative ranges. However, on  during her below average performance, said performance was worsened by visual scanning difficulties rather than diminished processing speed. Basic attention was below average. More complex attention (e.g., working memory) was also below average. Executive functioning was average to above average.  Assessed receptive language abilities were average. Likewise, Ms. Palma did not exhibit any difficulties comprehending task instructions and answered all questions asked of her appropriately. Assessed expressive language (e.g., verbal fluency and confrontation naming) was average to above average.     Assessed visuospatial/visuoconstructional abilities were average to exceptionally high.    Learning (i.e., encoding) of novel verbal and visual information was below average across a story task but well above average to exceptionally high across other tasks. Spontaneous delayed recall (i.e., retrieval) of previously learned information was average to well above average. Retention rates were 88% across a story learning task, 78% (spontaneous) to 89% (cued) across a list learning task, and 91% across a shape learning task. Performance across recognition tasks was strong, suggesting evidence for information consolidation.   Results of emotional screening instruments suggested that recent symptoms of generalized anxiety were in the minimal range, while symptoms of depression were within normal limits. A screening instrument assessing recent sleep quality suggested the presence of minimal sleep  dysfunction.  Tables of Scores:   Note: This summary of test scores accompanies the interpretive report and should not be considered in isolation without reference to the appropriate sections in the text. Descriptors are based on appropriate normative data and may be adjusted based on clinical judgment. The terms "impaired" and "within normal limits (WNL)" are used when a more specific level of functioning cannot be determined. Descriptors refer to the current evaluation only.         Validity Testing:    [facility]   June 2021 Current    Dot Counting Test: --- --- --- Within Expectation  CVLT-III Forced Choice Recognition: --- --- --- Within Expectation  BVMT-R Retention Percentage: --- --- --- Within Expectation  D-KEFS Color Word Effort Index: --- --- --- Within Expectation        Orientation:       Raw Score Raw Score Percentile   NAB Orientation, Form 1 29/29 29/29 --- ---        Cognitive Screening:       Raw Score Raw Score Percentile   SLUMS: --- 23/30 --- ---        Intellectual Functioning:       Standard Score Standard Score Percentile   Test of Premorbid Functioning: 113 119 90 Above Average        Memory:      Wechsler Memory Scale (WMS-IV):                       Raw Score (Scaled Score) Raw Score (Scaled Score) Percentile     Logical Memory I 17/53 (6) 20/53 (7) 16 Below Average    Logical Memory II 14/39 (9) 14/39 (9) 37 Average    Logical Memory Recognition 13/23 18/23 26-50 Average        [place] Verbal Learning Test (CVLT-III) Brief  Form: Raw Score (Scaled/Standard Score) Raw Score (Scaled/Standard Score) Percentile     Total Trials 1-4 29/36 (114) 34/36 (135) 99 Exceptionally High    Short-Delay Free Recall 7/9 (10) 8/9 (12) 75 Above Average    Long-Delay Free Recall 7/9 (11) 8/9 (13) 84 Above Average    Long-Delay Cued Recall 7/9 (11) 7/9 (11) 63 Average      Recognition Hits 9/9 (13) 8/9 (10) 50 Average      False Positive Errors 5 (2) 1 (9) 37  Average        Brief Visuospatial Memory Test (BVMT-R), Form 1: Raw Score Raw Score (T Score) Percentile     Total Trials 1-3 17/36 24/36 (64) 92 Well Above Average    Delayed Recall 9/12 10/12 (63) 91 Well Above Average    Recognition Discrimination Index 6 6 (57) 75 Above Average      Recognition Hits 6/6 6/6 (56) 73 Average      False Positive Errors 0 0 (54) 66 Average  *From Riki Sheer (2016)            Attention/Executive Function:      Trail Making Test (TMT): Raw Score (T Score) Raw Score (T Score) Percentile     Part A 57 secs.,  0 errors (36) 48 secs., 0  errors (40) 16 Below Average    Part B 133 secs.,  2 errors (39) 105 secs.,  1 error (43) 25 Average          Scaled Score Scaled Score Percentile   WAIS-IV Coding: 12 13 84 Above Average        NAB Attention Module, Form 1: T Score T Score Percentile     Digits Forward 38 38 12 Below Average    Digits Backwards 34 40 16 Below Average        D-KEFS Color-Word Interference Test: Raw Score (Scaled Score) Raw Score (Scaled Score) Percentile     Color Naming 33 secs. (10) 27 secs. (13) 84 Above Average    Word Reading 18 secs. (14) 17 secs. (14) 91 Above Average    Inhibition 66 secs. (11) 82 secs. (9) 37 Average      Total Errors 0 errors (13) 1 error (12) 75 Above Average    Inhibition/Switching 81 secs. (10) 62 secs. (12) 75 Above Average      Total Errors 0 errors (13) 0 errors (13) 84 Above Average        D-KEFS 20 Questions Test: Scaled Score Scaled Score Percentile     Total Weighted Achievement Score 8 13 84 Above Average    Initial Abstraction Score 11 11 63 Average        Language:      Verbal Fluency Test: Raw Score (T Score) Raw Score (T Score) Percentile     Phonemic Fluency (FAS) 59 (65) 56 (62) 88 Above Average    Animal Fluency 21 (55) 17 (47) 38 Average         NAB Language Module, Form 1: T Score T Score Percentile     Auditory Comprehension 41 54 66 Average    Naming 28/31 (40) 30/31 (59) 82  Above Average        Visuospatial/Visuoconstruction:       Raw Score Raw Score Percentile   Clock Drawing: 8/10 9/10 --- Within Normal Limits        NAB Spatial Module, Form 1: T Score T Score Percentile     Figure Drawing Copy 57 72  99 Exceptionally High         Scaled Score Scaled Score Percentile   WAIS-IV Block Design: 12 10 50 Average        Mood and Personality:       Raw Score Raw Score Percentile   Geriatric Depression Scale: 9 6 --- Within Normal Limits  Geriatric Anxiety Scale: 12 10 --- Minimal    Somatic 3 5 --- Minimal    Cognitive 3 3 --- Mild    Affective 6 2 --- Minimal        Additional Questionnaires:       Raw Score Raw Score Percentile   PROMIS Sleep Disturbance Questionnaire: 25 20 --- None to Slight   Informed Consent and Coding/Compliance:   The current evaluation represents a clinical evaluation for the purposes previously outlined by the referral source and is in no way reflective of a forensic evaluation.   Ms. Wahlstrom was provided with a verbal description of the nature and purpose of the present neuropsychological evaluation. Also reviewed were the foreseeable risks and/or discomforts and benefits of the procedure, limits of confidentiality, and mandatory reporting requirements of this provider. The patient was given the opportunity to ask questions and receive answers about the evaluation. Oral consent to participate was provided by the patient.   This evaluation was conducted by Christia Reading, Ph.D., licensed clinical neuropsychologist. Ms. Alemu completed a clinical interview with Dr. Melvyn Novas. However, given its focus only on changes within the past year, nothing was billed for this portion of the evaluation. She completed 145 minutes of cognitive testing and scoring, billed as one unit [phone] and four additional units [id number]. Psychometrist Cruzita Lederer, B.S., assisted Dr. Melvyn Novas with test administration and scoring procedures. As a separate and discrete  service, Dr. Melvyn Novas spent a total of 130 minutes in interpretation and report writing billed as one unit [phone] and one unit [phone].

## 2020-11-05 NOTE — Progress Notes (Signed)
   Psychometrician Note   Cognitive testing was administered to Chana Bode by Shan Levans, B.S. (psychometrist) under the supervision of Dr. Newman Nickels, Ph.D., licensed psychologist on 11/05/2020. Ms. Dileo did not appear overtly distressed by the testing session per behavioral observation or responses across self-report questionnaires. Rest breaks were offered.    The battery of tests administered was selected by Dr. Newman Nickels, Ph.D. with consideration to Ms. Pardo current level of functioning, the nature of her symptoms, emotional and behavioral responses during interview, level of literacy, observed level of motivation/effort, and the nature of the referral question. This battery was communicated to the psychometrist. Communication between Dr. Newman Nickels, Ph.D. and the psychometrist was ongoing throughout the evaluation and Dr. Newman Nickels, Ph.D. was immediately accessible at all times. Dr. Newman Nickels, Ph.D. provided supervision to the psychometrist on the date of this service to the extent necessary to assure the quality of all services provided.    Chana Bode will return within approximately 1-2 weeks for an interactive feedback session with Dr. Milbert Coulter at which time her test performances, clinical impressions, and treatment recommendations will be reviewed in detail. Ms. Wootan understands she can contact our office should she require our assistance before this time.  A total of 145 minutes of billable time were spent face-to-face with Ms. Ludden by the psychometrist. This includes both test administration and scoring time. Billing for these services is reflected in the clinical report generated by Dr. Newman Nickels, Ph.D.  This note reflects time spent with the psychometrician and does not include test scores or any clinical interpretations made by Dr. Milbert Coulter. The full report will follow in a separate note.

## 2020-11-12 ENCOUNTER — Encounter: Payer: Medicare PPO | Admitting: Psychology

## 2020-11-18 ENCOUNTER — Other Ambulatory Visit: Payer: Self-pay

## 2020-11-18 ENCOUNTER — Ambulatory Visit (INDEPENDENT_AMBULATORY_CARE_PROVIDER_SITE_OTHER): Payer: Medicare PPO | Admitting: Psychology

## 2020-11-18 DIAGNOSIS — R41840 Attention and concentration deficit: Secondary | ICD-10-CM

## 2020-11-18 NOTE — Progress Notes (Signed)
   Neuropsychology Feedback Session Anne [name]. [facility] Department of Neurology  Reason for Referral:   Anne [name] is a 80 y.o. right-handed Caucasian female referred by Patrcia Dolly, M.D., to characterize her current cognitive functioning and assist with diagnostic clarity and treatment planning in the context of prior diagnosis of a mild neurocognitive disorder, longstanding traits of inattention/distractibility, and a family history of Alzheimer's disease.  Feedback:   Anne [name] completed a comprehensive neuropsychological evaluation on 11/05/2020. Please refer to that encounter for the full report and recommendations. Briefly, results suggested neuropsychological functioning largely within normal limits relative to age-matched peers. A relative weakness was exhibited across attention/concentration; however, performances remained in the below average range. Mild variability was noted across processing speed, likely related to some trouble with visual scanning across lower performing tasks. Performance was overall appropriate across executive functioning, receptive and expressive language, visuospatial abilities, and learning and memory. Anne [name] denied difficulties completing instrumental activities of daily living (ADLs) independently. Relative to her prior evaluation in June 2021, there were no areas which exhibited significant cognitive decline and actually were a few areas which showed modest improvements.  Anne [name] was accompanied by her daughter during the current feedback session. Content of the current session focused on the results of her neuropsychological evaluation. Anne [name] and her daughter were given the opportunity to ask questions and their questions were answered. They were encouraged to reach out should additional questions arise. A copy of her report was provided at the conclusion of the visit.      30 minutes were spent conducting the current  feedback session with Anne [name], billed as one unit [phone].

## 2020-11-19 ENCOUNTER — Ambulatory Visit (INDEPENDENT_AMBULATORY_CARE_PROVIDER_SITE_OTHER): Payer: Medicare PPO | Admitting: Family Medicine

## 2020-11-19 ENCOUNTER — Encounter: Payer: Medicare PPO | Admitting: Psychology

## 2020-11-19 VITALS — Ht 66.0 in | Wt 160.0 lb

## 2020-11-19 DIAGNOSIS — M19079 Primary osteoarthritis, unspecified ankle and foot: Secondary | ICD-10-CM | POA: Diagnosis not present

## 2020-11-19 DIAGNOSIS — M722 Plantar fascial fibromatosis: Secondary | ICD-10-CM | POA: Diagnosis not present

## 2020-11-19 DIAGNOSIS — M2141 Flat foot [pes planus] (acquired), right foot: Secondary | ICD-10-CM | POA: Diagnosis not present

## 2020-11-19 DIAGNOSIS — M2142 Flat foot [pes planus] (acquired), left foot: Secondary | ICD-10-CM | POA: Diagnosis not present

## 2020-11-19 NOTE — Progress Notes (Signed)
Anne [name] presents for custom orthotics.  She is recently seen here and had green insoles placed with scaphoid pad and some additional padding along with the left heel wedge for leg length discrepancy.  She reports she has had incredible improvement in her symptoms with this and is excited about the custom orthotic.  She also got new shoes at [employer] .  Patient was fitted for a : standard, cushioned, semi-rigid orthotic. The orthotic was heated and afterward the patient stood on the orthotic blank positioned on the orthotic stand. The patient was positioned in subtalar neutral position and 10 degrees of ankle dorsiflexion in a weight bearing stance. After completion of molding, a stable base was applied to the orthotic blank. The blank was ground to a stable position for weight bearing. Size: 9 Base: Blue EVA Posting: None Additional orthotic padding: Heel lift placed on left  Patient trialed the orthotics in her new shoes and reported comfort after making some minor adjustments.  We discussed that she can follow-up anytime for further adjustments as needed.  Guy Sandifer, MD Sports Medicine Fellow, [place]  Addendum:  I was the preceptor for this visit and available for immediate consultation.  Norton Blizzard MD Marrianne Mood

## 2020-11-20 NOTE — Addendum Note (Signed)
Addended by: Rosann Auerbach C on: 11/20/2020 10:26 AM   Modules accepted: Orders

## 2020-12-30 ENCOUNTER — Ambulatory Visit
Admission: RE | Admit: 2020-12-30 | Discharge: 2020-12-30 | Disposition: A | Discharge status: Home / Self Care | Payer: Medicare PPO | Source: Ambulatory Visit | Attending: Sports Medicine | Admitting: Sports Medicine

## 2020-12-30 ENCOUNTER — Ambulatory Visit: Payer: Medicare PPO | Admitting: Sports Medicine

## 2020-12-30 ENCOUNTER — Other Ambulatory Visit: Payer: Self-pay

## 2020-12-30 VITALS — BP 104/74 | Ht 66.0 in | Wt 158.0 lb

## 2020-12-30 DIAGNOSIS — R269 Unspecified abnormalities of gait and mobility: Secondary | ICD-10-CM

## 2020-12-30 DIAGNOSIS — M25551 Pain in right hip: Secondary | ICD-10-CM

## 2020-12-30 DIAGNOSIS — M1611 Unilateral primary osteoarthritis, right hip: Secondary | ICD-10-CM | POA: Diagnosis not present

## 2020-12-30 NOTE — Progress Notes (Addendum)
PCP: Gweneth Dimitri, MD  Subjective:   HPI: Patient is a 80 y.o. female here for follow-up of leg length discrepancy and pes planus.  Patient reports today after custom orthotics were made for her on 11/19/2020.  She states it took a few weeks for her to get comfortable with these, although now she is finding improvement.  Her plantar fascia pain is certainly improved.  She did return with her custom green insoles today, as these are beginning to wear out.  She continues to be active working on her balance, aquatics, and home exercises.  When she left home to go back to [place] last month, she did forget her exercise sheet to there and does need new ones printed out currently.  Denies any new injuries or pain today.   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 (Patient not taking: Reported on 10/14/2020)     Cholecalciferol (VITAMIN D-3) 5000 UNITS TABS Take 1 tablet by mouth daily. (Patient not taking: Reported on 10/14/2020)     hyoscyamine (LEVSIN SL) 0.125 MG SL tablet Place 0.125 mg under the tongue every 4 (four) hours as needed for cramping. (Patient not taking: Reported on 10/14/2020)     Multiple Vitamin (MULTIVITAMIN WITH MINERALS) TABS tablet Take 1 tablet by mouth daily. (Patient not taking: Reported on 10/14/2020)     MYRBETRIQ 50 MG TB24 tablet      sertraline (ZOLOFT) 100 MG  tablet Take 1 and 1/2 tablet daily 135 tablet 3   No current facility-administered medications on file prior to visit.    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   Procedure: LEFT TOTAL HIP ARTHROPLASTY ANTERIOR APPROACH;  Surgeon: Shelda Pal, MD;  Location: [facility];  Service: Orthopedics;  Laterality: Left;    Allergies  Allergen Reactions   Sulfa Antibiotics Nausea Only    Social History   Socioeconomic History   Marital status: Widowed    Spouse name: Not on file   Number of children: Not on file   Years of education: 16   Highest education level: Bachelor's degree (e.g., BA, AB, BS)  Occupational History   Occupation: Retired    Comment: Runner, broadcasting/film/video  Tobacco Use   Smoking status: Never   Smokeless tobacco: Never  Vaping Use   Vaping Use: Never used  Substance and Sexual Activity   Alcohol use: Yes    Alcohol/week: 7.0 standard drinks    Types: 7 Glasses of wine per week    Comment: 1 small  glass of wine/night   Drug use: No   Sexual activity: Not on file  Other Topics Concern   Not on file  Social History Narrative   Right handed    Lives with daughter    Has twins    Social Determinants of Health   Financial Resource Strain: Not on file  Food Insecurity: Not on file  Transportation Needs: Not on file  Physical Activity: Not on file  Stress: Not on file  Social Connections: Not on file  Intimate Partner Violence: Not on file    Family History  Problem Relation Age of Onset   Breast cancer Mother 34   Alzheimer's disease Mother    Breast cancer Maternal Aunt 40   Alzheimer's disease Maternal Aunt    Breast cancer Maternal Aunt 40   Alzheimer's disease Maternal Aunt    Vascular Disease Maternal Uncle         Vascular dementia presentation   Dementia Maternal Uncle     BP 104/74   Ht 5\' 6"  (1.676 m)   Wt 158 lb (71.7 kg)   BMI 25.50 kg/m   No flowsheet data found.  No flowsheet data found.  Review of Systems: See HPI above.     Objective:  Physical Exam:  Gen: Well-appearing, in no acute distress; non-toxic CV: Regular Rate. Well-perfused. Warm.  Resp: Breathing unlabored on room air; no wheezing. Psych: Fluid speech in conversation; appropriate affect; normal thought process MSK:   Hips/LE:  - left leg approximately 1.5cm shorter on the left - strength 5/5 in hip and knee flexion/extension - NVI, gross sensation to light touch intact on dorsal and plantar aspect of the lower extremities  Bilateral feet: -There is no ecchymosis, erythema, or warmth -With standing, the patient does have a significant collapse of the longitudinal and transverse arch bilaterally.  There is notable TMT bossing of the left foot, with navicular drop when she stands on her feet -Full range of motion of the ankle bilaterally in all directions -5/5 strength of bilateral feet and to -Neurovascularly intact, gross sensation to light touch intact in dorsal and plantar aspects of the feet   Assessment & Plan:  1.  Left foot plantar fasciitis - improving 2.  Bilateral foot pes planus - stable 3.  Leg length discrepancy, left short leg about 1.5 cm - corrected with left heel lift in insole/orthotic  In general, the patient's gait and function is improving with her custom orthotics and green insoles. She did recently get new [employer] shoes which are a size bigger than her normal shoes, she does have room for the orthotic to slide within the shoe, which I believe is giving her some issues.  However, her plantar fascia pain is improving.  - HEP exercises reviewed and re-printed for patient today (hip abduction, ER, lateral step-ups, balancing exercises) - custom green insoles with lateral heel wedge (left  higher) and b/l scaphoid pads replaced and provided to patient today - We evaluated her custom orthotics and these are appropriate for her patient, but her new shoes are too big and allow for too much sliding of her foot/orthotic. She is to return to the store for a proper size, which should help with her symptoms. - Follow-up in 2-3 months or sooner if needed  I observed and examined the patient with the [facility] resident and agree with assessment and plan.  Note reviewed and modified by me. [facility], MD

## 2021-02-04 ENCOUNTER — Encounter: Payer: Self-pay | Admitting: Neurology

## 2021-02-04 ENCOUNTER — Other Ambulatory Visit: Payer: Self-pay

## 2021-02-04 ENCOUNTER — Ambulatory Visit: Payer: Medicare PPO | Admitting: Neurology

## 2021-02-04 VITALS — BP 151/70 | HR 68 | Ht 66.0 in | Wt 157.8 lb

## 2021-02-04 DIAGNOSIS — R41840 Attention and concentration deficit: Secondary | ICD-10-CM | POA: Diagnosis not present

## 2021-02-04 NOTE — Progress Notes (Signed)
NEUROLOGY FOLLOW UP OFFICE NOTE  Anne [name] [id number] 03-18-1941  HISTORY OF PRESENT ILLNESS: I had the pleasure of seeing Anne [name] in follow-up in the neurology clinic on 02/04/2021.  The patient was last seen 6 months ago for memory changes. She is alone in the office today. Records and images were personally reviewed where available.  Since her last visit, She had a Psychological evaluation with Dr. Reggy [name] in 08/2020 where she did not meet full criterion for ADHD but presents with organizational and behavior regulation deficits similar to this condition. Mild memory and motor deficits also noted, that could be related to a precursor for dementia (due to family history) or part of her mildly depressed mood. She then had repeat Neuropsychological testing in 10/2020 with Dr. Milbert [name] which was largely within normal limits. Compared to prior testing, there were no areas of significant decline, some with modest improvements. Etiology for experienced cognitive dysfunction felt likely due to a combination of traits of ADHD with distractibility, inattention, and disorganization, as well as ongoing stressors (conflicts with daughter, declining health of her mother, anxiety), pain, and sleep disruption.  Findings were discussed with her today, we discussed there is no indication for a neurodegenerative condition at this time. She reports when she is rushing she gets distracted, for instance this morning she went to a different specialist's office and had to race to get back here. She denies missing medications or bill payments. She has been accepted at [facility] but she is not ready to go yet and is really resistant to selling her house. She has a very hard time making decisions. She stays healthy and goes 5 hours a week to the [facility] No falls.    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 She reports her memory is "terrible." Her daughter Anne [name] is present to provide information, and states she started noticing changes a couple of years ago. The patient reports she has always been great with names, but now cannot remember them, which really bothers her. She misplaces things, Anne [name] reports that she is misplacing things constantly on a daily basis, which is frustrating. This morning she could not find her medications and was frantic. Anne [name] had been living in [place] and would provide tech support, but noticed she would not retain information even in the same conversation. She would not remember passwords and Anne [name] would have to repeat things. The patient is very close to her near-35 year old mother who lives in [place], she and Anne [name] went to visit her in September. Anne [name] has been staying with her since, and has noticed worsening symptoms from baseline. She has always been "ADD," always the person looking for things even when Anne [name] was younger, but things have gotten worse. Following a recipe can be challenging. She does not remember how to work the [employer]. They both deny getting lost driving, but Anne [name] reports "it is like an internal motor going, she always has to get somewhere ASAP and prefers the most direct route," then gets distracted when driving. She denies leaving the stove on. Although she misplaces her medications, she denies missing doses. Most of her bills are on autopay, Anne [name] has helped her be more organized with her stacks of bills coming in. She feels her daughter has made things more focused for her, but that her daughter wants things "done her way, I'm not  that way, I like to do things my way." She reports feeling very overwhelmed, she stayed with her mother for 3 months, and was depressed when she came back in December. Anne [name] has been telling her she needs to sell her house but she does not want to sell. She gets tearful several times  during the visit. She reports untidiness and not being able to garden like before. Anne [name] has noticed an impulsivity that is different, she had a contractor show her windows for her home and "made the snap decision to buy $4500," and now Anne [name] is in the process of undoing the contract. She gets angry when you try to slow her down, but they are not sure if this is new. Anne [name] feels that something happened when she came back from [place], she has noticed a "lack of awareness, like she is not there." Anne [name] feels like she needs a Manufacturing engineer always behind her, because she forgets to complete tasks.   She denies any dizziness, diplopia, dysarthria/dysphagia, neck/back pain, focal numbness/tingling/weakness, bowel dysfunction, anosmia. She has occasional sinus headaches and blurred vision. She has been having pains in her left foot and diagnosed with plantar fasciitis. She has had worsening urinary incontinence that Myrbetriq has helped with. She has noticed slight hand tremors. She usually gets 5 hours of sleep which she attributes to difficulties with time change due to travel and her bladder. She needs naps. There is a strong family history of Alzheimer's disease in her mother and 4 maternal aunts. A maternal uncle had vascular dementia. No history of significant head injuries. She reports that every night she and her daughter go through a bottle of wine (2-1/2 glasses every night). No paranoia or hallucinations.   Diagnostic Data: MRI brain done 11/2019 did not show any acute changes, there was mild diffuse volume loss and mild chronic microvascular disease.  Neuropsychological testing in 10/2019 showed primary weakness surrounding attention/concentration. Diagnosis of Mild Neurocognitive disorder, etiology unclear. Across ADHD questionnaire, she scored in the "likely to have ADHD" range across the inattention subscale. It was noted that cognitive issues could also be exacerbated by poor sleep,  headaches, chronic pain, mild anxiety. Testing overall was not consistent with typical AD presentation.  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 (Patient not taking: Reported on 10/14/2020)     Cholecalciferol (VITAMIN D-3) 5000 UNITS TABS Take 1 tablet by mouth daily. (Patient not taking: Reported on 10/14/2020)     hyoscyamine (LEVSIN SL) 0.125 MG SL tablet Place 0.125 mg under the tongue every 4 (four) hours as needed for cramping. (Patient not taking: Reported on 10/14/2020)     Multiple Vitamin (MULTIVITAMIN WITH MINERALS) TABS tablet Take 1 tablet by mouth daily. (Patient not taking: Reported on  10/14/2020)     MYRBETRIQ 50 MG TB24 tablet      sertraline (ZOLOFT) 100 MG tablet Take 1 and 1/2 tablet daily 135 tablet 3   No current facility-administered medications on file prior to visit.    ALLERGIES: Allergies  Allergen Reactions   Sulfa Antibiotics Nausea Only    FAMILY HISTORY: Family History  Problem Relation Age of Onset   Breast cancer Mother 79   Alzheimer's disease Mother    Breast cancer Maternal Aunt 40   Alzheimer's disease Maternal Aunt    Breast cancer Maternal Aunt 40   Alzheimer's disease Maternal Aunt    Vascular Disease Maternal Uncle        Vascular dementia presentation   Dementia Maternal Uncle     SOCIAL  HISTORY: Social History   Socioeconomic History   Marital status: Widowed    Spouse name: Not on file   Number of children: Not on file   Years of education: 16   Highest education level: Bachelor's degree (e.g., BA, AB, BS)  Occupational History   Occupation: Retired    Comment: Runner, broadcasting/film/video  Tobacco Use   Smoking status: Never   Smokeless tobacco: Never  Vaping Use   Vaping Use: Never used  Substance and Sexual Activity   Alcohol use: Yes    Alcohol/week: 7.0 standard drinks    Types: 7 Glasses of wine per week    Comment: 1 small glass of wine/night   Drug use: No   Sexual activity: Not on file  Other Topics Concern   Not on file  Social History Narrative   Right handed    Lives with daughter    Has twins    Social Determinants of Health   Financial Resource Strain: Not on file  Food Insecurity: Not on file  Transportation Needs: Not on file  Physical Activity: Not on file  Stress: Not on file  Social Connections: Not on file  Intimate Partner Violence: Not on file     PHYSICAL EXAM: Vitals:   02/04/21 1006  BP: (!) 151/70  Pulse: 68  SpO2: 97%   General: No acute distress Head:  Normocephalic/atraumatic Skin/Extremities: No rash, no edema Neurological Exam: alert and oriented to person, place, and time. No aphasia or dysarthria. Fund of knowledge is appropriate.  Attention and concentration are reduced, verbose and needs redirection.   Cranial nerves: Pupils equal, round. Extraocular movements intact with no nystagmus. Visual fields full.  No facial asymmetry.  Motor: Bulk and tone normal, muscle strength 5/5 throughout with no pronator drift.   Finger to nose testing intact.  Gait narrow-based and steady, no ataxia.   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 Her initial Neuropsychological evaluation in June 2021 indicated Mild Neurocognitive disorder, with  primary weakness in attention/concentration. She saw psychologist Dr. Reggy [name] who noted she does not meet full criterion for ADHD but presents with organizational and behavior regulation deficits similar to this condition. Repeat Neuropsychological evaluation in 10/2020 was within normal limits, with note that while prior testing diagnosed Mild Neurocognitive disorder, improvements and overall intact cognitive profile on repeat testing renders this diagnosis inappropriate at this time. We discussed that there is no indication of a neurodegenerative disorder at this time, and factors affecting cognition include attention deficits, ongoing stressors, pain, sleep disruption. She will follow-up with her PCP to continue to address these issues, she would like to see a  therapist. We discussed the importance of control of vascular risk factors, physical exercise, brain stimulation exercises, and MIND diet for overall brain health. Follow-up as needed, call for any changes.    Thank you for allowing me to participate in her care.  Please do not hesitate to call for any questions or concerns.    Patrcia Dolly, M.D.   CC: Dr. Corliss Blacker

## 2021-02-04 NOTE — Patient Instructions (Signed)
Good to see you! As we discussed, please speak to Dr. Corliss Blacker about the attention deficit, anxiety, depression, sleep difficulties, and seeing a counselor. Follow-up as needed, call for any changes.   RECOMMENDATIONS FOR ALL PATIENTS WITH MEMORY PROBLEMS: 1. Continue to exercise (Recommend 30 minutes of walking everyday, or 3 hours every week) 2. Increase social interactions - continue going to [place] and enjoy social gatherings with friends and family 3. Eat healthy, avoid fried foods and eat more fruits and vegetables 4. Maintain adequate blood pressure, blood sugar, and blood cholesterol level. Reducing the risk of stroke and cardiovascular disease also helps promoting better memory. 5. Avoid stressful situations. Live a simple life and avoid aggravations. Organize your time and prepare for the next day in anticipation. 6. Sleep well, avoid any interruptions of sleep and avoid any distractions in the bedroom that may interfere with adequate sleep quality 7. Avoid sugar, avoid sweets as there is a strong link between excessive sugar intake, diabetes, and cognitive impairment We discussed the Mediterranean diet, which has been shown to help patients reduce the risk of progressive memory disorders and reduces cardiovascular risk. This includes eating fish, eat fruits and green leafy vegetables, nuts like almonds and hazelnuts, walnuts, and also use olive oil. Avoid fast foods and fried foods as much as possible. Avoid sweets and sugar as sugar use has been linked to worsening of memory function.

## 2021-02-06 DIAGNOSIS — Z23 Encounter for immunization: Secondary | ICD-10-CM | POA: Diagnosis not present

## 2021-02-06 DIAGNOSIS — Z022 Encounter for examination for admission to residential institution: Secondary | ICD-10-CM | POA: Diagnosis not present

## 2021-02-25 ENCOUNTER — Ambulatory Visit: Payer: Medicare PPO | Admitting: Family Medicine

## 2021-03-10 DIAGNOSIS — F411 Generalized anxiety disorder: Secondary | ICD-10-CM | POA: Diagnosis not present

## 2021-03-10 DIAGNOSIS — Z8249 Family history of ischemic heart disease and other diseases of the circulatory system: Secondary | ICD-10-CM | POA: Diagnosis not present

## 2021-03-10 DIAGNOSIS — N3941 Urge incontinence: Secondary | ICD-10-CM | POA: Diagnosis not present

## 2021-03-10 DIAGNOSIS — R030 Elevated blood-pressure reading, without diagnosis of hypertension: Secondary | ICD-10-CM | POA: Diagnosis not present

## 2021-03-10 DIAGNOSIS — Z791 Long term (current) use of non-steroidal anti-inflammatories (NSAID): Secondary | ICD-10-CM | POA: Diagnosis not present

## 2021-03-10 DIAGNOSIS — Z96642 Presence of left artificial hip joint: Secondary | ICD-10-CM | POA: Diagnosis not present

## 2021-03-10 DIAGNOSIS — G8929 Other chronic pain: Secondary | ICD-10-CM | POA: Diagnosis not present

## 2021-03-10 DIAGNOSIS — F3341 Major depressive disorder, recurrent, in partial remission: Secondary | ICD-10-CM | POA: Diagnosis not present

## 2021-03-16 DIAGNOSIS — R8290 Unspecified abnormal findings in urine: Secondary | ICD-10-CM | POA: Diagnosis not present

## 2021-03-16 DIAGNOSIS — R5383 Other fatigue: Secondary | ICD-10-CM | POA: Diagnosis not present

## 2021-03-16 DIAGNOSIS — Z6825 Body mass index (BMI) 25.0-25.9, adult: Secondary | ICD-10-CM | POA: Diagnosis not present

## 2021-03-17 ENCOUNTER — Ambulatory Visit: Payer: Medicare PPO | Admitting: Sports Medicine

## 2021-03-17 VITALS — BP 120/72 | Ht 66.0 in | Wt 159.0 lb

## 2021-03-17 DIAGNOSIS — M722 Plantar fascial fibromatosis: Secondary | ICD-10-CM

## 2021-03-24 ENCOUNTER — Ambulatory Visit: Payer: Medicare PPO | Admitting: Sports Medicine

## 2021-03-24 DIAGNOSIS — R21 Rash and other nonspecific skin eruption: Secondary | ICD-10-CM | POA: Diagnosis not present

## 2021-03-24 DIAGNOSIS — R52 Pain, unspecified: Secondary | ICD-10-CM | POA: Diagnosis not present

## 2021-03-24 DIAGNOSIS — R5383 Other fatigue: Secondary | ICD-10-CM | POA: Diagnosis not present

## 2021-03-24 DIAGNOSIS — J029 Acute pharyngitis, unspecified: Secondary | ICD-10-CM | POA: Diagnosis not present

## 2021-03-24 DIAGNOSIS — U071 COVID-19: Secondary | ICD-10-CM | POA: Diagnosis not present

## 2021-03-24 DIAGNOSIS — R051 Acute cough: Secondary | ICD-10-CM | POA: Diagnosis not present

## 2021-04-01 ENCOUNTER — Ambulatory Visit: Payer: Medicare PPO | Admitting: Neurology

## 2021-04-02 DIAGNOSIS — L509 Urticaria, unspecified: Secondary | ICD-10-CM | POA: Diagnosis not present

## 2021-05-08 NOTE — Progress Notes (Signed)
Appt cancelled

## 2021-06-02 ENCOUNTER — Ambulatory Visit (INDEPENDENT_AMBULATORY_CARE_PROVIDER_SITE_OTHER): Payer: Medicare PPO | Admitting: Sports Medicine

## 2021-06-02 DIAGNOSIS — R269 Unspecified abnormalities of gait and mobility: Secondary | ICD-10-CM

## 2021-06-02 DIAGNOSIS — M19079 Primary osteoarthritis, unspecified ankle and foot: Secondary | ICD-10-CM

## 2021-06-02 NOTE — Progress Notes (Signed)
CC: Left foot pain  Patient with a history of foot pain but much worse over the past year Told she may have PFasciitis We had seen her for hip issues after THR on 5/22 At that time she had some plantar fasciitis but more importantly she had midfoot arthritis and navicular drop with pronation and loss of arch Sports insoles with additional arch support helped lessen her walking pain Leg length difference noted  She comes for recheck since she cannot walk as much as before She does water yoga and tries to stay active  PE Pleasant older F in NAD There were no vitals taken for this visit.   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

## 2021-06-02 NOTE — Patient Instructions (Signed)
Unfortunately you have Charcot foot After years of pronation you tear the spring ligament This leads to midfoot arthritis and navicular drop  We have to support with a soft orthotic Water exercise Use Voltaren gel - over the counter - 3 to 4 times per day  Soft and highly padded shoes Consider a HOKA  We will make you new inserts anytime these wear out

## 2021-06-02 NOTE — Assessment & Plan Note (Signed)
This is improved with sports insoles  With custom orthotics see if we can make this even more normal

## 2021-06-02 NOTE — Assessment & Plan Note (Signed)
The midfoot arthritis is getting more painful Voltaren gel daily Continue the sports insole support with scaphoid pads Return for a trial of custom orthotics but with some higher volume, cushioned shoes

## 2021-08-25 ENCOUNTER — Other Ambulatory Visit: Payer: Self-pay | Admitting: Neurology

## 2021-09-08 DIAGNOSIS — M85851 Other specified disorders of bone density and structure, right thigh: Secondary | ICD-10-CM | POA: Diagnosis not present

## 2021-09-14 DIAGNOSIS — F3342 Major depressive disorder, recurrent, in full remission: Secondary | ICD-10-CM | POA: Diagnosis not present

## 2021-09-14 DIAGNOSIS — K589 Irritable bowel syndrome without diarrhea: Secondary | ICD-10-CM | POA: Diagnosis not present

## 2021-09-14 DIAGNOSIS — F419 Anxiety disorder, unspecified: Secondary | ICD-10-CM | POA: Diagnosis not present

## 2021-09-14 DIAGNOSIS — J301 Allergic rhinitis due to pollen: Secondary | ICD-10-CM | POA: Diagnosis not present

## 2021-09-14 DIAGNOSIS — Z0000 Encounter for general adult medical examination without abnormal findings: Secondary | ICD-10-CM | POA: Diagnosis not present

## 2021-09-14 DIAGNOSIS — M85851 Other specified disorders of bone density and structure, right thigh: Secondary | ICD-10-CM | POA: Diagnosis not present

## 2021-09-14 DIAGNOSIS — N3941 Urge incontinence: Secondary | ICD-10-CM | POA: Diagnosis not present

## 2021-09-25 ENCOUNTER — Other Ambulatory Visit: Payer: Self-pay | Admitting: Family Medicine

## 2021-09-25 DIAGNOSIS — Z1231 Encounter for screening mammogram for malignant neoplasm of breast: Secondary | ICD-10-CM

## 2021-10-20 ENCOUNTER — Ambulatory Visit
Admission: RE | Admit: 2021-10-20 | Discharge: 2021-10-20 | Disposition: A | Discharge status: Home / Self Care | Payer: Medicare PPO | Source: Ambulatory Visit | Attending: Family Medicine | Admitting: Family Medicine

## 2021-10-20 DIAGNOSIS — Z1231 Encounter for screening mammogram for malignant neoplasm of breast: Secondary | ICD-10-CM

## 2021-11-10 DIAGNOSIS — H04123 Dry eye syndrome of bilateral lacrimal glands: Secondary | ICD-10-CM | POA: Diagnosis not present

## 2021-11-10 DIAGNOSIS — H18513 Endothelial corneal dystrophy, bilateral: Secondary | ICD-10-CM | POA: Diagnosis not present

## 2021-11-10 DIAGNOSIS — H40013 Open angle with borderline findings, low risk, bilateral: Secondary | ICD-10-CM | POA: Diagnosis not present

## 2021-11-10 DIAGNOSIS — H00022 Hordeolum internum right lower eyelid: Secondary | ICD-10-CM | POA: Diagnosis not present

## 2021-11-10 DIAGNOSIS — H0102A Squamous blepharitis right eye, upper and lower eyelids: Secondary | ICD-10-CM | POA: Diagnosis not present

## 2021-11-10 DIAGNOSIS — H0102B Squamous blepharitis left eye, upper and lower eyelids: Secondary | ICD-10-CM | POA: Diagnosis not present

## 2021-11-10 DIAGNOSIS — H353131 Nonexudative age-related macular degeneration, bilateral, early dry stage: Secondary | ICD-10-CM | POA: Diagnosis not present

## 2021-11-10 DIAGNOSIS — Z961 Presence of intraocular lens: Secondary | ICD-10-CM | POA: Diagnosis not present

## 2021-11-10 DIAGNOSIS — H1045 Other chronic allergic conjunctivitis: Secondary | ICD-10-CM | POA: Diagnosis not present

## 2021-11-12 DIAGNOSIS — H00022 Hordeolum internum right lower eyelid: Secondary | ICD-10-CM | POA: Diagnosis not present

## 2021-11-20 ENCOUNTER — Other Ambulatory Visit: Payer: Self-pay | Admitting: Neurology

## 2021-11-20 DIAGNOSIS — N3001 Acute cystitis with hematuria: Secondary | ICD-10-CM | POA: Diagnosis not present

## 2022-01-18 IMAGING — MG MM DIGITAL SCREENING BILAT W/ TOMO AND CAD
8 series · 8 of 24 positions shown · non-contrast
Comparison: Previous exam(s).

CLINICAL DATA: Screening.

EXAM:
DIGITAL SCREENING BILATERAL MAMMOGRAM WITH TOMOSYNTHESIS AND CAD
TECHNIQUE: Bilateral screening digital craniocaudal and mediolateral oblique
mammograms were obtained. Bilateral screening digital breast
tomosynthesis was performed. The images were evaluated with
computer-aided detection.

[L CC synth-2D]
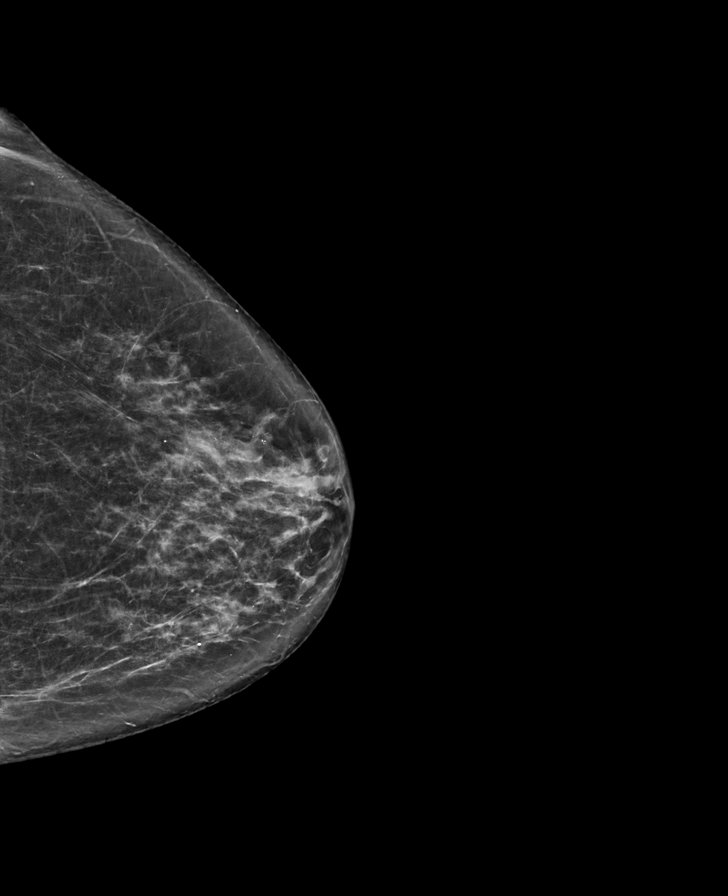

[R MLO synth-2D]
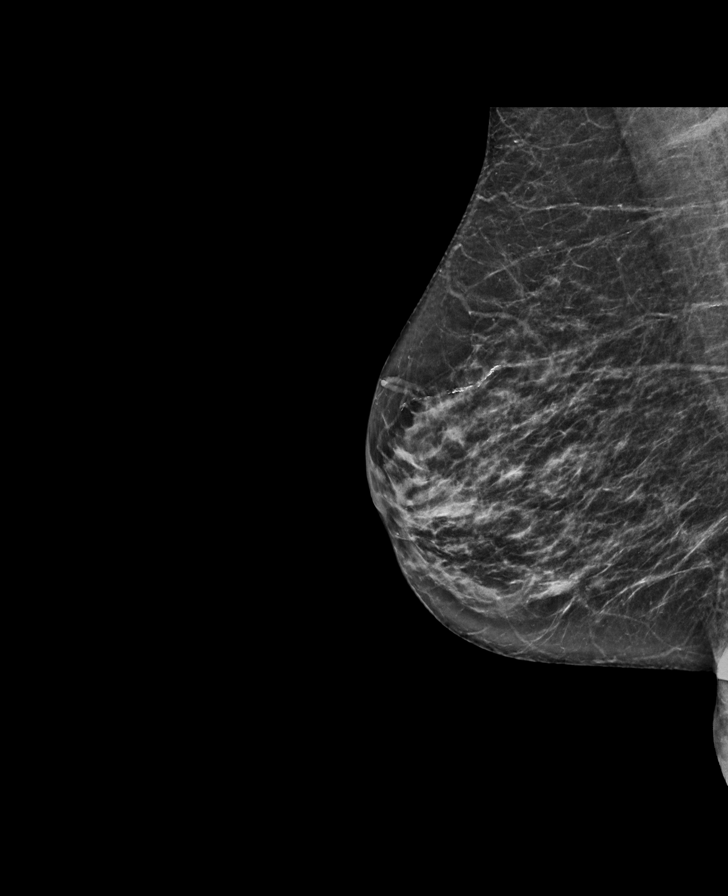

[L MLO synth-2D]
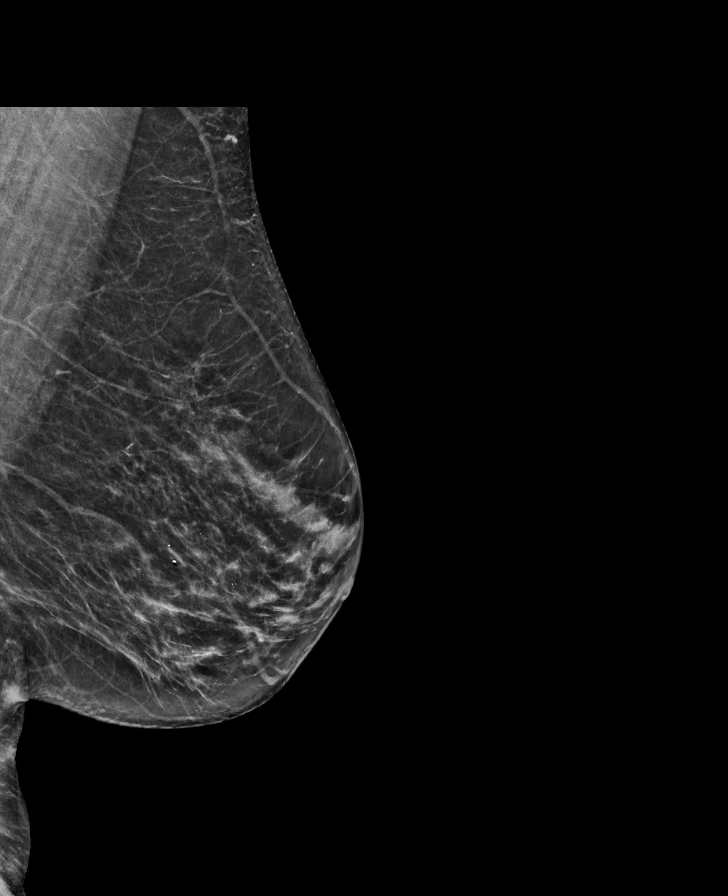

[R CC synth-2D]
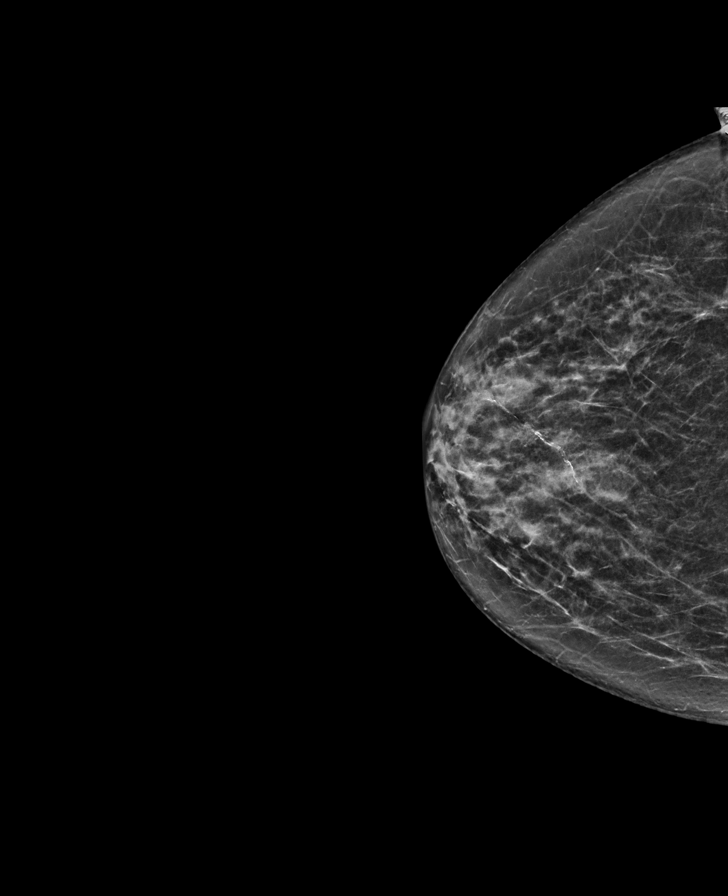

[R CC tomo · tomo slice 31/62.0]
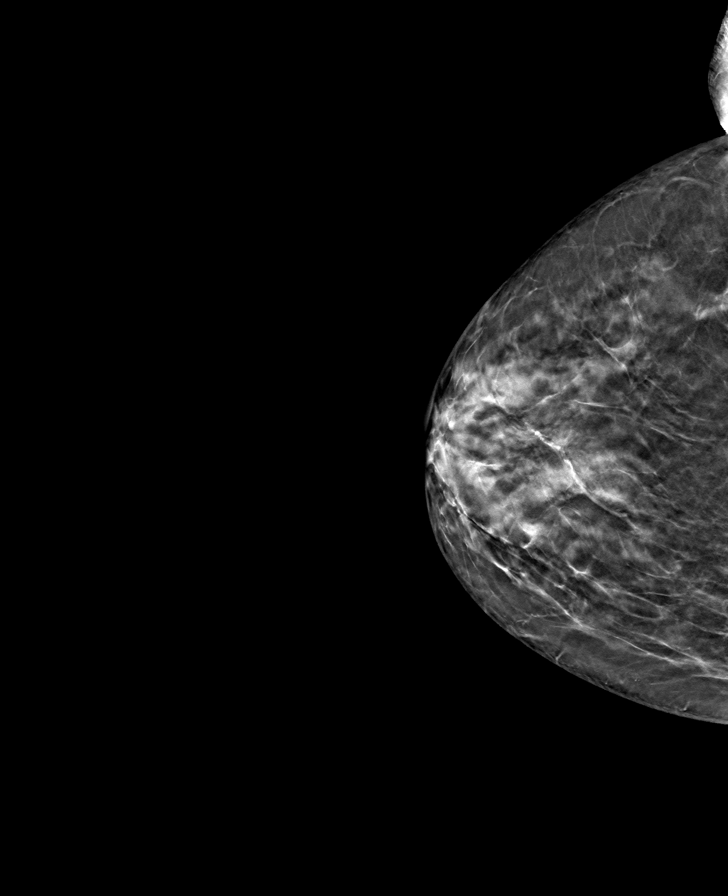

[L MLO tomo · tomo slice 33/65.0]
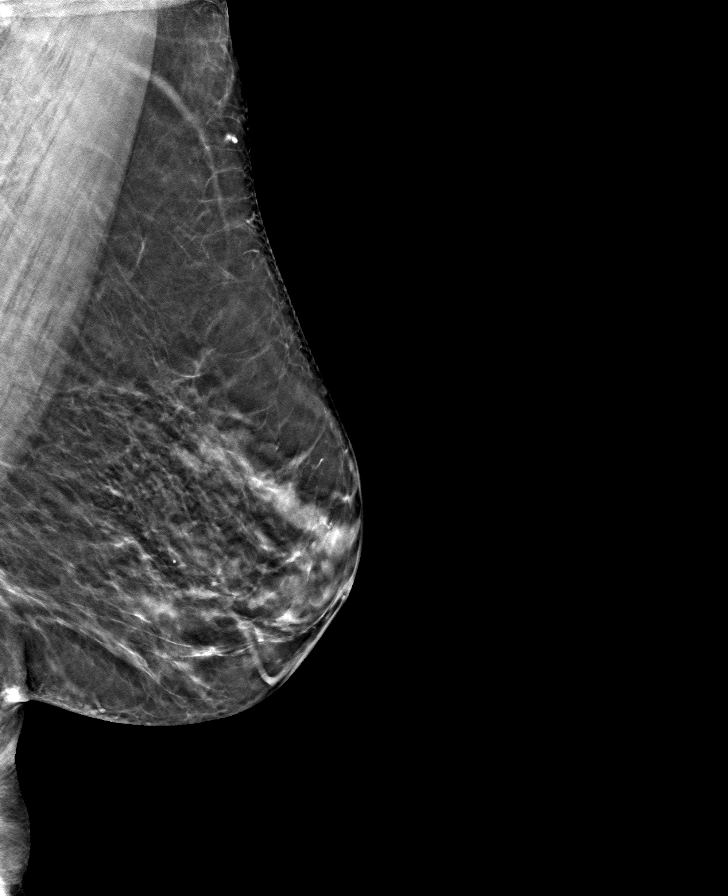

[R MLO tomo · tomo slice 31/60.0]
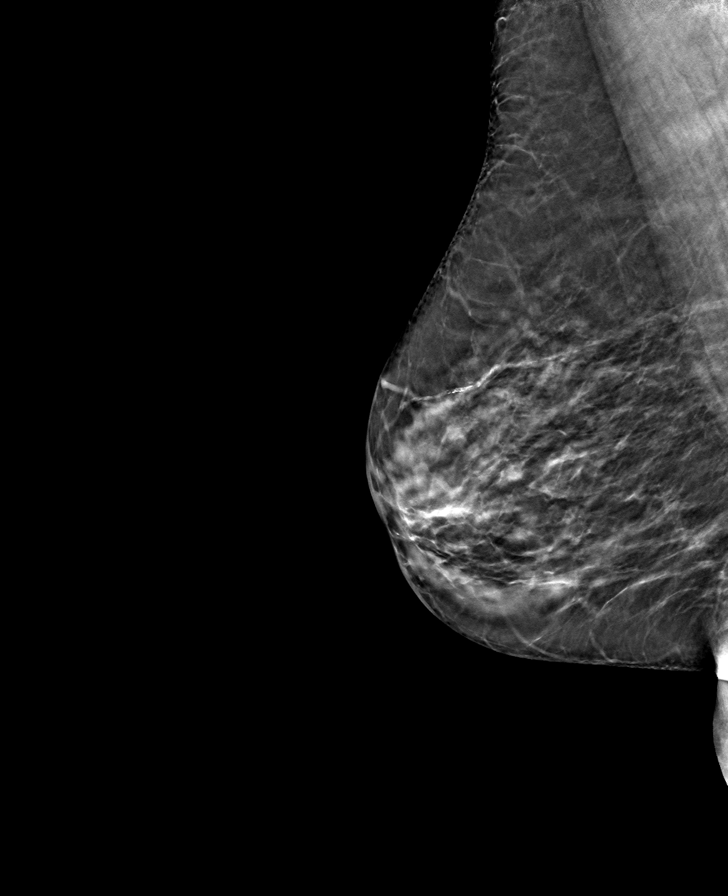

[L CC tomo · tomo slice 35/70.0]
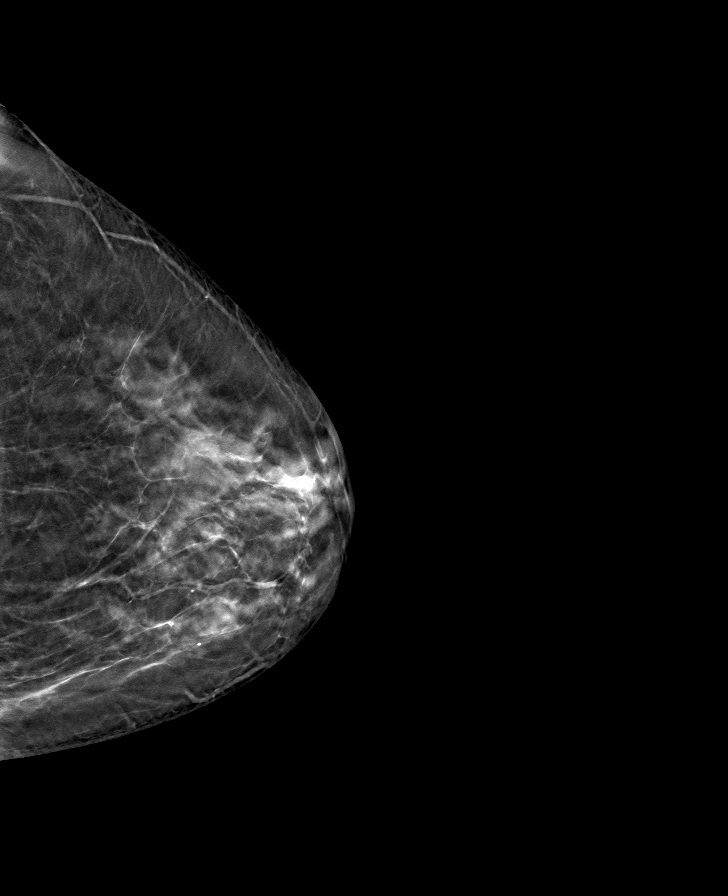

[8 of 24 positions shown; findings below may reference images not displayed]

ACR Breast Density Category c: The breast tissue is heterogeneously
dense, which may obscure small masses.
FINDINGS: There are no findings suspicious for malignancy. The images were
evaluated with computer-aided detection.
IMPRESSION: No mammographic evidence of malignancy. A result letter of this
screening mammogram will be mailed directly to the patient.

RECOMMENDATION:
Screening mammogram in one year. (Code:T4-5-GWO)

BI-RADS CATEGORY  1: Negative.

## 2022-05-05 DIAGNOSIS — J40 Bronchitis, not specified as acute or chronic: Secondary | ICD-10-CM | POA: Diagnosis not present

## 2022-05-05 DIAGNOSIS — J029 Acute pharyngitis, unspecified: Secondary | ICD-10-CM | POA: Diagnosis not present

## 2022-05-05 DIAGNOSIS — J329 Chronic sinusitis, unspecified: Secondary | ICD-10-CM | POA: Diagnosis not present

## 2022-05-05 DIAGNOSIS — R5383 Other fatigue: Secondary | ICD-10-CM | POA: Diagnosis not present

## 2022-08-12 DIAGNOSIS — F419 Anxiety disorder, unspecified: Secondary | ICD-10-CM | POA: Diagnosis not present

## 2022-08-12 DIAGNOSIS — R29898 Other symptoms and signs involving the musculoskeletal system: Secondary | ICD-10-CM | POA: Diagnosis not present

## 2022-08-12 DIAGNOSIS — Z6824 Body mass index (BMI) 24.0-24.9, adult: Secondary | ICD-10-CM | POA: Diagnosis not present

## 2022-09-20 DIAGNOSIS — M85851 Other specified disorders of bone density and structure, right thigh: Secondary | ICD-10-CM | POA: Diagnosis not present

## 2022-09-23 DIAGNOSIS — J301 Allergic rhinitis due to pollen: Secondary | ICD-10-CM | POA: Diagnosis not present

## 2022-09-23 DIAGNOSIS — M85851 Other specified disorders of bone density and structure, right thigh: Secondary | ICD-10-CM | POA: Diagnosis not present

## 2022-09-23 DIAGNOSIS — N3941 Urge incontinence: Secondary | ICD-10-CM | POA: Diagnosis not present

## 2022-09-23 DIAGNOSIS — Z6824 Body mass index (BMI) 24.0-24.9, adult: Secondary | ICD-10-CM | POA: Diagnosis not present

## 2022-09-23 DIAGNOSIS — Z01419 Encounter for gynecological examination (general) (routine) without abnormal findings: Secondary | ICD-10-CM | POA: Diagnosis not present

## 2022-09-23 DIAGNOSIS — Z0000 Encounter for general adult medical examination without abnormal findings: Secondary | ICD-10-CM | POA: Diagnosis not present

## 2022-09-23 DIAGNOSIS — F3342 Major depressive disorder, recurrent, in full remission: Secondary | ICD-10-CM | POA: Diagnosis not present

## 2022-09-23 DIAGNOSIS — F419 Anxiety disorder, unspecified: Secondary | ICD-10-CM | POA: Diagnosis not present

## 2022-11-04 DIAGNOSIS — R300 Dysuria: Secondary | ICD-10-CM | POA: Diagnosis not present

## 2022-11-18 DIAGNOSIS — R519 Headache, unspecified: Secondary | ICD-10-CM | POA: Diagnosis not present

## 2022-11-18 DIAGNOSIS — R197 Diarrhea, unspecified: Secondary | ICD-10-CM | POA: Diagnosis not present

## 2022-11-18 DIAGNOSIS — R110 Nausea: Secondary | ICD-10-CM | POA: Diagnosis not present

## 2022-11-18 DIAGNOSIS — Z6823 Body mass index (BMI) 23.0-23.9, adult: Secondary | ICD-10-CM | POA: Diagnosis not present

## 2022-11-21 ENCOUNTER — Other Ambulatory Visit: Payer: Self-pay

## 2022-11-21 ENCOUNTER — Inpatient Hospital Stay (HOSPITAL_BASED_OUTPATIENT_CLINIC_OR_DEPARTMENT_OTHER)
Admission: EM | Admit: 2022-11-21 | Discharge: 2022-11-24 | DRG: 373 | Disposition: A | Discharge status: Home / Self Care | Payer: Medicare PPO | Attending: Internal Medicine | Admitting: Internal Medicine

## 2022-11-21 ENCOUNTER — Encounter (HOSPITAL_BASED_OUTPATIENT_CLINIC_OR_DEPARTMENT_OTHER): Payer: Self-pay

## 2022-11-21 ENCOUNTER — Emergency Department (HOSPITAL_BASED_OUTPATIENT_CLINIC_OR_DEPARTMENT_OTHER): Payer: Medicare PPO

## 2022-11-21 DIAGNOSIS — R509 Fever, unspecified: Secondary | ICD-10-CM

## 2022-11-21 DIAGNOSIS — F411 Generalized anxiety disorder: Secondary | ICD-10-CM | POA: Diagnosis present

## 2022-11-21 DIAGNOSIS — K529 Noninfective gastroenteritis and colitis, unspecified: Principal | ICD-10-CM

## 2022-11-21 DIAGNOSIS — Z820 Family history of epilepsy and other diseases of the nervous system: Secondary | ICD-10-CM

## 2022-11-21 DIAGNOSIS — Z79899 Other long term (current) drug therapy: Secondary | ICD-10-CM

## 2022-11-21 DIAGNOSIS — Z882 Allergy status to sulfonamides status: Secondary | ICD-10-CM

## 2022-11-21 DIAGNOSIS — D696 Thrombocytopenia, unspecified: Secondary | ICD-10-CM | POA: Diagnosis present

## 2022-11-21 DIAGNOSIS — A09 Infectious gastroenteritis and colitis, unspecified: Secondary | ICD-10-CM

## 2022-11-21 DIAGNOSIS — Z90710 Acquired absence of both cervix and uterus: Secondary | ICD-10-CM | POA: Diagnosis not present

## 2022-11-21 DIAGNOSIS — M1990 Unspecified osteoarthritis, unspecified site: Secondary | ICD-10-CM | POA: Diagnosis present

## 2022-11-21 DIAGNOSIS — Z96642 Presence of left artificial hip joint: Secondary | ICD-10-CM | POA: Diagnosis present

## 2022-11-21 DIAGNOSIS — K5989 Other specified functional intestinal disorders: Secondary | ICD-10-CM | POA: Diagnosis not present

## 2022-11-21 DIAGNOSIS — E876 Hypokalemia: Secondary | ICD-10-CM | POA: Diagnosis not present

## 2022-11-21 DIAGNOSIS — R197 Diarrhea, unspecified: Secondary | ICD-10-CM | POA: Diagnosis present

## 2022-11-21 DIAGNOSIS — R32 Unspecified urinary incontinence: Secondary | ICD-10-CM | POA: Diagnosis present

## 2022-11-21 DIAGNOSIS — F325 Major depressive disorder, single episode, in full remission: Secondary | ICD-10-CM | POA: Diagnosis present

## 2022-11-21 DIAGNOSIS — A498 Other bacterial infections of unspecified site: Secondary | ICD-10-CM | POA: Diagnosis present

## 2022-11-21 DIAGNOSIS — Z803 Family history of malignant neoplasm of breast: Secondary | ICD-10-CM | POA: Diagnosis not present

## 2022-11-21 DIAGNOSIS — A0472 Enterocolitis due to Clostridium difficile, not specified as recurrent: Principal | ICD-10-CM | POA: Diagnosis present

## 2022-11-21 DIAGNOSIS — G3184 Mild cognitive impairment, so stated: Secondary | ICD-10-CM | POA: Diagnosis present

## 2022-11-21 DIAGNOSIS — R1110 Vomiting, unspecified: Secondary | ICD-10-CM | POA: Diagnosis not present

## 2022-11-21 DIAGNOSIS — R9431 Abnormal electrocardiogram [ECG] [EKG]: Secondary | ICD-10-CM | POA: Diagnosis not present

## 2022-11-21 LAB — MAGNESIUM: Magnesium: 1.6 mg/dL — ABNORMAL LOW (ref 1.7–2.4)

## 2022-11-21 LAB — CBC
HCT: 37.1 % (ref 36.0–46.0)
Hemoglobin: 12.5 g/dL (ref 12.0–15.0)
MCH: 30.5 pg (ref 26.0–34.0)
MCHC: 33.7 g/dL (ref 30.0–36.0)
MCV: 90.5 fL (ref 80.0–100.0)
Platelets: 148 10*3/uL — ABNORMAL LOW (ref 150–400)
RBC: 4.1 MIL/uL (ref 3.87–5.11)
RDW: 12.8 % (ref 11.5–15.5)
WBC: 4.6 10*3/uL (ref 4.0–10.5)
nRBC: 0 % (ref 0.0–0.2)

## 2022-11-21 LAB — COMPREHENSIVE METABOLIC PANEL
ALT: 10 U/L (ref 0–44)
AST: 12 U/L — ABNORMAL LOW (ref 15–41)
Albumin: 3.8 g/dL (ref 3.5–5.0)
Alkaline Phosphatase: 46 U/L (ref 38–126)
Anion gap: 9 (ref 5–15)
BUN: 12 mg/dL (ref 8–23)
CO2: 27 mmol/L (ref 22–32)
Calcium: 8.8 mg/dL — ABNORMAL LOW (ref 8.9–10.3)
Chloride: 98 mmol/L (ref 98–111)
Creatinine, Ser: 0.83 mg/dL (ref 0.44–1.00)
GFR, Estimated: >60 mL/min (ref >60)
Glucose, Bld: 121 mg/dL — ABNORMAL HIGH (ref 70–99)
Potassium: 4.2 mmol/L (ref 3.5–5.1)
Sodium: 134 mmol/L — ABNORMAL LOW (ref 135–145)
Total Bilirubin: 0.7 mg/dL (ref 0.3–1.2)
Total Protein: 6.6 g/dL (ref 6.5–8.1)

## 2022-11-21 LAB — PHOSPHORUS: Phosphorus: 2.5 mg/dL (ref 2.5–4.6)

## 2022-11-21 LAB — LIPASE, BLOOD: Lipase: <10 U/L — ABNORMAL LOW (ref 11–51)

## 2022-11-21 MED ORDER — ACETAMINOPHEN 325 MG PO TABS
650.0000 mg | ORAL_TABLET | Freq: Once | ORAL | Status: AC
  Administered 2022-11-21: 650 mg via ORAL
  Filled 2022-11-21: qty 2

## 2022-11-21 MED ORDER — ONDANSETRON HCL 4 MG/2ML IJ SOLN
4.0000 mg | Freq: Once | INTRAMUSCULAR | Status: AC
  Administered 2022-11-21: 4 mg via INTRAVENOUS
  Filled 2022-11-21: qty 2

## 2022-11-21 MED ORDER — IOHEXOL 300 MG/ML  SOLN
100.0000 mL | Freq: Once | INTRAMUSCULAR | Status: AC | PRN
  Administered 2022-11-21: 100 mL via INTRAVENOUS

## 2022-11-21 MED ORDER — MAGNESIUM SULFATE 50 % IJ SOLN
1.0000 g | Freq: Once | INTRAMUSCULAR | Status: AC
  Administered 2022-11-21: 1 g via INTRAVENOUS
  Filled 2022-11-21: qty 2

## 2022-11-21 MED ORDER — SODIUM CHLORIDE 0.9 % IV BOLUS
1000.0000 mL | Freq: Once | INTRAVENOUS | Status: AC
  Administered 2022-11-21: 1000 mL via INTRAVENOUS

## 2022-11-21 MED ORDER — VANCOMYCIN HCL 125 MG PO CAPS: 125.0000 mg | ORAL_CAPSULE | Freq: Four times a day (QID) | ORAL | 0 refills | Status: DC

## 2022-11-21 NOTE — Progress Notes (Signed)
Plan of Care Note for accepted transfer   Patient: Anne [name] MRN: [id number]   DOA: 11/21/2022  Facility requesting transfer: [facility]   Requesting Provider: Barnie Alderman, PA  Reason for transfer: Infectious diarrhea   Facility course: 82 yr old lady with anxiety and recent completion of treatment for UTI who p/w 4 days malaise, aches, and diarrhea (10 episodes/day).   She is febrile with normal WBC. CT demonstrates mild hyperemia of the colon suggestive of colitis.   C diff testing and GI pathogen panel were ordered and pt was treated with IVF, Zofran, APAP, and magnesium.   Plan of care: The patient is accepted for admission to Med-surg  unit, at [facility].   Author: Briscoe Deutscher, MD 11/21/2022  Check [email or web address] for on-call coverage.  Nursing staff, Please call [facility] Admits & Consults System-Wide number on Amion as soon as patient's arrival, so appropriate admitting provider can evaluate the pt.

## 2022-11-21 NOTE — ED Triage Notes (Signed)
Pt reports feeling unwell since Wednesday, has been having diarrhea since then associated with muscle aches. She just finished a 7 day course of Augmentin for a UTI. She reports feeling weak and lack of appetite. She started vomiting today associated with abd cramping.

## 2022-11-21 NOTE — ED Notes (Signed)
RT assessed pt for desaturation issues. Pt desats while sleeping. RT placed pt on [place] 2 Lpm while sleeping to maintain sats of >90%. Pt sats post placement 96%. RT will continue to monitor while at [facility].    11/21/22 2131  Oxygen Therapy/Pulse Ox  O2 Device (S)  Nasal Cannula  O2 Therapy (S)  Oxygen  O2 Flow Rate (L/min) (S)  2 L/min  FiO2 (%) (S)  28 %  SpO2 (S)  93 %

## 2022-11-21 NOTE — ED Provider Notes (Signed)
[facility] EMERGENCY DEPARTMENT AT [facility] Provider Note   CSN: [id number] Arrival date & time: 11/21/22  1830     History  Chief Complaint  Patient presents with   Diarrhea    Anne [name] is a 82 y.o. female, no pertinent past medical history, who presents to the ED secondary to profuse diarrhea for the last week.  She states that she had a UTI about 2 weeks ago, finished a course of Augmentin, last Wednesday 6/26, and has had persistent diarrhea since then.  She states that she is having 10+ episodes of diarrhea a day, and cannot control her bowels.  States that since she started having diarrhea, she took a few doses of amoxicillin but this made the diarrhea worse.  She states it is foul-smelling, but there is no blood in it.  Reports abdominal cramping, bilateral lower quadrants, with associated nausea.  Has had 1 episode of vomiting since arriving to the ER.  Denies any kind of fevers or chills.  Just feels very weak.  Reduced appetite.    Home Medications Prior to Admission medications   Medication Sig Start Date End Date Taking? Authorizing Provider  vancomycin (VANCOCIN) 125 MG capsule Take 1 capsule (125 mg total) by mouth 4 (four) times daily. Please only take this antibiotic, if you have C. difficile.  Please check your MyChart for this.  If you do not have C. difficile do not take this antibiotic. 11/21/22  Yes Naasir Carreira L, PA  calcium carbonate (OS-CAL - DOSED IN MG OF ELEMENTAL CALCIUM) 1250 (500 Ca) MG tablet Take 1 tablet by mouth. Patient not taking: No sig reported    [provider]  Cholecalciferol (VITAMIN D-3) 25 MCG (1000 UT) CAPS Take 1 tablet by mouth daily.    [provider]  hyoscyamine (LEVSIN SL) 0.125 MG SL tablet Place 0.125 mg under the tongue every 4 (four) hours as needed for cramping.    [provider]  Multiple Vitamin (MULTIVITAMIN WITH MINERALS) TABS tablet Take 1 tablet by mouth daily.    [provider]  MYRBETRIQ 50 MG TB24 tablet  09/12/19   [provider]  sertraline (ZOLOFT) 100 MG tablet Take 1 and 1/2 tablet daily 07/25/20   Van Clines, MD      Allergies    Sulfa antibiotics    Review of Systems   Review of Systems  Gastrointestinal:  Positive for abdominal pain, diarrhea, nausea and vomiting. Negative for constipation.    Physical Exam Updated Vital Signs BP (!) 104/44   Pulse 83   Temp (!) 101 F (38.3 C) (Oral)   Resp (!) 22   Ht 5\' 6"  (1.676 m)   Wt 66.5 kg   SpO2 100%   BMI 23.66 kg/m  Physical Exam Vitals and nursing note reviewed.  Constitutional:      General: She is not in acute distress.    Appearance: She is well-developed.  HENT:     Head: Normocephalic and atraumatic.  Eyes:     Conjunctiva/sclera: Conjunctivae normal.  Cardiovascular:     Rate and Rhythm: Normal rate and regular rhythm.     Heart sounds: No murmur heard. Pulmonary:     Effort: Pulmonary effort is normal. No respiratory distress.     Breath sounds: Normal breath sounds.  Abdominal:     Palpations: Abdomen is soft.     Tenderness: There is abdominal tenderness in the right lower quadrant and left lower quadrant.  Musculoskeletal:  General: No swelling.     Cervical back: Neck supple.  Skin:    General: Skin is warm and dry.     Capillary Refill: Capillary refill takes less than 2 seconds.  Neurological:     Mental Status: She is alert.  Psychiatric:        Mood and Affect: Mood normal.     ED Results / Procedures / Treatments   Labs (all labs ordered are listed, but only abnormal results are displayed) Labs Reviewed  LIPASE, BLOOD - Abnormal; Notable for the following components:      Result Value   Lipase <10 (*)    All other components within normal limits  COMPREHENSIVE METABOLIC PANEL - Abnormal; Notable for the following components:   Sodium 134 (*)    Glucose, Bld 121 (*)    Calcium 8.8 (*)    AST 12 (*)    All other  components within normal limits  CBC - Abnormal; Notable for the following components:   Platelets 148 (*)    All other components within normal limits  MAGNESIUM - Abnormal; Notable for the following components:   Magnesium 1.6 (*)    All other components within normal limits  C DIFFICILE QUICK SCREEN W PCR REFLEX    GASTROINTESTINAL PANEL BY PCR, STOOL (REPLACES STOOL CULTURE)  PHOSPHORUS  URINALYSIS, ROUTINE W REFLEX MICROSCOPIC    EKG EKG Interpretation Date/Time:  Sunday November 21 2022 19:18:35 EDT Ventricular Rate:  80 PR Interval:  161 QRS Duration:  116 QT Interval:  405 QTC Calculation: 468 R Axis:   87  Text Interpretation: Sinus arrhythmia Nonspecific intraventricular conduction delay Low voltage, extremity and precordial leads no prio rECG for comaprison.  artifact present. No STEMI Confirmed by Theda Belfast ([id number]) on 11/21/2022 8:35:18 PM  Radiology CT ABDOMEN PELVIS W CONTRAST  Result Date: 11/21/2022 CLINICAL DATA:  Diarrhea and recent vomiting, initial encounter EXAM: CT ABDOMEN AND PELVIS WITH CONTRAST TECHNIQUE: Multidetector CT imaging of the abdomen and pelvis was performed using the standard protocol following bolus administration of intravenous contrast. RADIATION DOSE REDUCTION: This exam was performed according to the departmental dose-optimization program which includes automated exposure control, adjustment of the mA and/or kV according to patient size and/or use of iterative reconstruction technique. CONTRAST:  OMNIPAQUE IOHEXOL 300 MG/ML  SOLN COMPARISON:  None Available. FINDINGS: Lower chest: No acute abnormality. Hepatobiliary: No focal liver abnormality is seen. No gallstones, gallbladder wall thickening, or biliary dilatation. Pancreas: Unremarkable. No pancreatic ductal dilatation or surrounding inflammatory changes. Spleen: Normal in size without focal abnormality. Adrenals/Urinary Tract: Adrenal glands are within normal limits. Kidneys  demonstrate a normal enhancement pattern bilaterally. No renal calculi or obstructive changes are seen. Normal excretion is noted on delayed images. Bladder is decompressed. Stomach/Bowel: Mild hyperemia is noted within the colon which may represent a generalized colitis. No obstructive changes are seen. Mild fluid is noted consistent with the diarrheal state. The appendix is not well visualized and may have been surgically removed. No inflammatory changes are seen to suggest appendicitis. Delmo Matty bowel and stomach are within normal limits. Vascular/Lymphatic: Aortic atherosclerosis. No enlarged abdominal or pelvic lymph nodes. Reproductive: Status post hysterectomy. No adnexal masses. Other: No abdominal wall hernia or abnormality. No abdominopelvic ascites. Musculoskeletal: Left hip replacement is noted. Degenerative changes of lumbar spine are seen. IMPRESSION: Mild hyperemia in the colon which may represent a generalized colitis. No obstructive changes are seen. No other focal abnormality is noted. Electronically Signed   By: Loraine Leriche  Lukens M.D.   On: 11/21/2022 19:52    Procedures Procedures    Medications Ordered in ED Medications  sodium chloride 0.9 % bolus 1,000 mL (0 mLs Intravenous Stopped 11/21/22 2125)  ondansetron (ZOFRAN) injection 4 mg (4 mg Intravenous Given 11/21/22 1921)  iohexol (OMNIPAQUE) 300 MG/ML solution 100 mL (100 mLs Intravenous Contrast Given 11/21/22 1938)  magnesium sulfate (IV Push/IM) injection 1 g (1 g Intravenous Given 11/21/22 2014)  acetaminophen (TYLENOL) tablet 650 mg (650 mg Oral Given 11/21/22 2344)    ED Course/ Medical Decision Making/ A&P                             Medical Decision Making Patient is an 82 year old female, here for abdominal pain, as well as diffuse diarrhea that has been going for the last week.  She states she is having frequent diarrhea, and cannot control her bowels.  Will obtain a CT scan as well as blood work for further evaluation.  She  will need a C. difficile test.  As she developed the diarrhea after taking antibiotics last week for UTI.  Amount and/or Complexity of Data Reviewed Labs: ordered.    Details: Mild hypomagnesemia 1.6 Radiology: ordered.    Details: CT abdomen pelvis, shows colitis with hyperemia of the colon Discussion of management or test interpretation with external provider(s): Discussed with patient, C. difficile testing will not be back until tomorrow, patient became febrile 101, and very weak unable to stand completely.  Not tolerating p.o.  She does have colitis, per CT findings, and given her age and increased risk of deterioration, I recommend admission to the hospital, patient is in agreement with this plan.  Patient has used the bathroom 5+ times while in the ER, foul-smelling, suspicious for C. difficile.  Admitted to Dr. Antionette Char for febrile  colitis.  Risk OTC drugs. Prescription drug management. Decision regarding hospitalization.    Final Clinical Impression(s) / ED Diagnoses Final diagnoses:  Colitis  Fever, unspecified fever cause       Kehaulani Fruin, Harley Alto, PA 11/21/22 2357    Tegeler, Canary Brim, MD 11/21/22 2358

## 2022-11-21 NOTE — ED Notes (Signed)
Patient updated about plan of care.

## 2022-11-22 ENCOUNTER — Encounter (HOSPITAL_COMMUNITY): Payer: Self-pay | Admitting: Family Medicine

## 2022-11-22 ENCOUNTER — Other Ambulatory Visit (HOSPITAL_COMMUNITY): Payer: Self-pay

## 2022-11-22 ENCOUNTER — Telehealth (HOSPITAL_BASED_OUTPATIENT_CLINIC_OR_DEPARTMENT_OTHER): Payer: Self-pay | Admitting: Emergency Medicine

## 2022-11-22 DIAGNOSIS — F325 Major depressive disorder, single episode, in full remission: Secondary | ICD-10-CM | POA: Diagnosis present

## 2022-11-22 DIAGNOSIS — R509 Fever, unspecified: Secondary | ICD-10-CM

## 2022-11-22 DIAGNOSIS — R32 Unspecified urinary incontinence: Secondary | ICD-10-CM | POA: Diagnosis not present

## 2022-11-22 DIAGNOSIS — M1990 Unspecified osteoarthritis, unspecified site: Secondary | ICD-10-CM | POA: Diagnosis present

## 2022-11-22 DIAGNOSIS — Z882 Allergy status to sulfonamides status: Secondary | ICD-10-CM | POA: Diagnosis not present

## 2022-11-22 DIAGNOSIS — D696 Thrombocytopenia, unspecified: Secondary | ICD-10-CM | POA: Diagnosis present

## 2022-11-22 DIAGNOSIS — A498 Other bacterial infections of unspecified site: Secondary | ICD-10-CM | POA: Diagnosis not present

## 2022-11-22 DIAGNOSIS — Z803 Family history of malignant neoplasm of breast: Secondary | ICD-10-CM | POA: Diagnosis not present

## 2022-11-22 DIAGNOSIS — A0472 Enterocolitis due to Clostridium difficile, not specified as recurrent: Secondary | ICD-10-CM | POA: Diagnosis present

## 2022-11-22 DIAGNOSIS — Z90710 Acquired absence of both cervix and uterus: Secondary | ICD-10-CM | POA: Diagnosis not present

## 2022-11-22 DIAGNOSIS — Z820 Family history of epilepsy and other diseases of the nervous system: Secondary | ICD-10-CM | POA: Diagnosis not present

## 2022-11-22 DIAGNOSIS — F411 Generalized anxiety disorder: Secondary | ICD-10-CM

## 2022-11-22 DIAGNOSIS — A09 Infectious gastroenteritis and colitis, unspecified: Secondary | ICD-10-CM | POA: Diagnosis not present

## 2022-11-22 DIAGNOSIS — E876 Hypokalemia: Secondary | ICD-10-CM | POA: Diagnosis not present

## 2022-11-22 DIAGNOSIS — Z96642 Presence of left artificial hip joint: Secondary | ICD-10-CM | POA: Diagnosis present

## 2022-11-22 DIAGNOSIS — R197 Diarrhea, unspecified: Secondary | ICD-10-CM | POA: Diagnosis present

## 2022-11-22 DIAGNOSIS — Z79899 Other long term (current) drug therapy: Secondary | ICD-10-CM | POA: Diagnosis not present

## 2022-11-22 DIAGNOSIS — K529 Noninfective gastroenteritis and colitis, unspecified: Principal | ICD-10-CM

## 2022-11-22 DIAGNOSIS — G3184 Mild cognitive impairment, so stated: Secondary | ICD-10-CM | POA: Diagnosis present

## 2022-11-22 LAB — CBC WITH DIFFERENTIAL/PLATELET
Abs Immature Granulocytes: 0.04 10*3/uL (ref 0.00–0.07)
Basophils Absolute: 0.1 10*3/uL (ref 0.0–0.1)
Basophils Relative: 1 %
Eosinophils Absolute: 0 10*3/uL (ref 0.0–0.5)
Eosinophils Relative: 0 %
HCT: 37.6 % (ref 36.0–46.0)
Hemoglobin: 12 g/dL (ref 12.0–15.0)
Immature Granulocytes: 1 %
Lymphocytes Relative: 12 %
Lymphs Abs: 0.8 10*3/uL (ref 0.7–4.0)
MCH: 30.7 pg (ref 26.0–34.0)
MCHC: 31.9 g/dL (ref 30.0–36.0)
MCV: 96.2 fL (ref 80.0–100.0)
Monocytes Absolute: 1.5 10*3/uL — ABNORMAL HIGH (ref 0.1–1.0)
Monocytes Relative: 23 %
Neutro Abs: 4.3 10*3/uL (ref 1.7–7.7)
Neutrophils Relative %: 63 %
Platelets: 147 10*3/uL — ABNORMAL LOW (ref 150–400)
RBC: 3.91 MIL/uL (ref 3.87–5.11)
RDW: 13.1 % (ref 11.5–15.5)
WBC: 6.7 10*3/uL (ref 4.0–10.5)
nRBC: 0 % (ref 0.0–0.2)

## 2022-11-22 LAB — COMPREHENSIVE METABOLIC PANEL
ALT: 14 U/L (ref 0–44)
AST: 15 U/L (ref 15–41)
Albumin: 3.1 g/dL — ABNORMAL LOW (ref 3.5–5.0)
Alkaline Phosphatase: 47 U/L (ref 38–126)
Anion gap: 9 (ref 5–15)
BUN: 11 mg/dL (ref 8–23)
CO2: 27 mmol/L (ref 22–32)
Calcium: 8.2 mg/dL — ABNORMAL LOW (ref 8.9–10.3)
Chloride: 100 mmol/L (ref 98–111)
Creatinine, Ser: 0.97 mg/dL (ref 0.44–1.00)
GFR, Estimated: 59 mL/min — ABNORMAL LOW (ref >60)
Glucose, Bld: 119 mg/dL — ABNORMAL HIGH (ref 70–99)
Potassium: 4 mmol/L (ref 3.5–5.1)
Sodium: 136 mmol/L (ref 135–145)
Total Bilirubin: 0.6 mg/dL (ref 0.3–1.2)
Total Protein: 6 g/dL — ABNORMAL LOW (ref 6.5–8.1)

## 2022-11-22 LAB — C DIFFICILE QUICK SCREEN W PCR REFLEX
C Diff antigen: POSITIVE — AB
C Diff antigen: POSITIVE — AB
C Diff interpretation: DETECTED
C Diff interpretation: DETECTED
C Diff toxin: POSITIVE — AB
C Diff toxin: POSITIVE — AB

## 2022-11-22 LAB — MAGNESIUM: Magnesium: 1.8 mg/dL (ref 1.7–2.4)

## 2022-11-22 MED ORDER — FIDAXOMICIN 200 MG PO TABS
200.0000 mg | ORAL_TABLET | Freq: Two times a day (BID) | ORAL | Status: DC
  Administered 2022-11-22 – 2022-11-24 (×5): 200 mg via ORAL
  Filled 2022-11-22 (×6): qty 1

## 2022-11-22 MED ORDER — PROCHLORPERAZINE EDISYLATE 10 MG/2ML IJ SOLN
5.0000 mg | Freq: Once | INTRAMUSCULAR | Status: AC
  Administered 2022-11-22: 5 mg via INTRAVENOUS
  Filled 2022-11-22: qty 2

## 2022-11-22 MED ORDER — NALOXONE HCL 0.4 MG/ML IJ SOLN: 0.4000 mg | INTRAMUSCULAR | Status: DC | PRN

## 2022-11-22 MED ORDER — VANCOMYCIN HCL 125 MG PO CAPS: 125.0000 mg | ORAL_CAPSULE | Freq: Four times a day (QID) | ORAL | Status: DC

## 2022-11-22 MED ORDER — BOOST / RESOURCE BREEZE PO LIQD CUSTOM
1.0000 | Freq: Three times a day (TID) | ORAL | Status: DC
  Administered 2022-11-22 – 2022-11-23 (×3): 1 via ORAL

## 2022-11-22 MED ORDER — MELATONIN 3 MG PO TABS: 3.0000 mg | ORAL_TABLET | Freq: Every evening | ORAL | Status: DC | PRN

## 2022-11-22 MED ORDER — LACTATED RINGERS IV SOLN
INTRAVENOUS | Status: AC
  Administered 2022-11-22: 1000 mL via INTRAVENOUS

## 2022-11-22 MED ORDER — HYDROMORPHONE HCL 1 MG/ML IJ SOLN
0.5000 mg | INTRAMUSCULAR | Status: DC | PRN
  Administered 2022-11-22: 0.5 mg via INTRAVENOUS
  Filled 2022-11-22: qty 0.5

## 2022-11-22 MED ORDER — ACETAMINOPHEN 325 MG PO TABS
650.0000 mg | ORAL_TABLET | Freq: Four times a day (QID) | ORAL | Status: DC | PRN
  Administered 2022-11-22 – 2022-11-24 (×4): 650 mg via ORAL
  Filled 2022-11-22 (×4): qty 2

## 2022-11-22 MED ORDER — ONDANSETRON HCL 4 MG/2ML IJ SOLN
4.0000 mg | Freq: Four times a day (QID) | INTRAMUSCULAR | Status: DC | PRN
  Administered 2022-11-22: 4 mg via INTRAVENOUS
  Filled 2022-11-22: qty 2

## 2022-11-22 MED ORDER — ACETAMINOPHEN 650 MG RE SUPP: 650.0000 mg | Freq: Four times a day (QID) | RECTAL | Status: DC | PRN

## 2022-11-22 NOTE — ED Notes (Signed)
ED TO INPATIENT HANDOFF REPORT  ED Nurse Name and Phone #: Lynett Fish RN [phone]  S Name/Age/Gender Anne [name] 82 y.o. female Room/Bed: DB013/DB013  Code Status   Code Status: Prior  Home/SNF/Other Home Patient oriented to: self, place, time, and situation Is this baseline? Yes   Triage Complete: Triage complete  Chief Complaint Infectious diarrhea in adult patient [A09]  Triage Note Pt reports feeling unwell since Wednesday, has been having diarrhea since then associated with muscle aches. She just finished a 7 day course of Augmentin for a UTI. She reports feeling weak and lack of appetite. She started vomiting today associated with abd cramping.    Allergies Allergies  Allergen Reactions   Sulfa Antibiotics Nausea Only    Level of Care/Admitting Diagnosis ED Disposition     ED Disposition  Admit   Condition  --   Comment  Hospital Area: [facility] [[id number]]  Level of Care: Med-Surg [16]  Interfacility transfer: Yes  May place patient in observation at [facility] or [facility] if equivalent level of care is available:: Yes  Covid Evaluation: Asymptomatic - no recent exposure (last 10 days) testing not required  Diagnosis: Infectious diarrhea in adult patient [308657]  Admitting Physician: Briscoe Deutscher [[id number]]  Attending Physician: Briscoe Deutscher [[id number]]          B Medical/Surgery History 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   Significant TMT change on left                Attention or concentration deficit    Generalized anxiety disorder    Major depressive disorder in remission    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   Osteoarthritis    Plantar fasciitis    Post-nasal drip    S/P left THA, 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   Strep throat 06/26/2013   Past Surgical History:  Procedure Laterality Date   ABDOMINAL HYSTERECTOMY   1988   and bladder tach done   BREAST CYST ASPIRATION Right    EYE SURGERY Bilateral 2013   cataracts removed both eyes   pinkie finger surgery Right yrs ago   TOTAL HIP ARTHROPLASTY Left 07/03/2013   Procedure: LEFT TOTAL HIP ARTHROPLASTY ANTERIOR APPROACH;  Surgeon: Shelda Pal, MD;  Location: [facility];  Service: Orthopedics;  Laterality: Left;     A IV Location/Drains/Wounds Patient Lines/Drains/Airways Status     Active Line/Drains/Airways     Name Placement date Placement time Site Days   Peripheral IV 11/21/22 20 G Left Antecubital 11/21/22  1846  Antecubital  1   Incision 07/03/13 Hip Left 07/03/13  0759  -- 3429            Intake/Output Last 24 hours  Intake/Output Summary (Last 24 hours) at 11/22/2022 0019 Last data filed at 11/21/2022 2125 Gross per 24 hour  Intake 1025 ml  Output --  Net 1025 ml    Labs/Imaging Results for orders placed or performed during the hospital encounter of 11/21/22 (from the past 48 hour(s))  Lipase, blood     Status: Abnormal   Collection Time: 11/21/22  6:46 PM  Result Value Ref Range   Lipase <10 (L) 11 - 51 U/L    Comment: Performed at [facility], [address], [place], [place] [address]  Comprehensive metabolic panel     Status: Abnormal   Collection Time: 11/21/22  6:46 PM  Result Value Ref Range   Sodium 134 (L) 135 - 145 mmol/L   Potassium 4.2 3.5 - 5.1 mmol/L   Chloride 98 98 - 111 mmol/L   CO2 27 22 - 32 mmol/L   Glucose, Bld 121 (H) 70 - 99 mg/dL    Comment: Glucose reference range applies only to samples taken after fasting for at least 8 hours.   BUN 12 8 - 23 mg/dL   Creatinine, Ser 1.61 0.44 - 1.00 mg/dL   Calcium 8.8 (L) 8.9 - 10.3 mg/dL   Total Protein 6.6 6.5 - 8.1 g/dL   Albumin 3.8 3.5 - 5.0 g/dL   AST 12 (L) 15 - 41 U/L   ALT 10 0 - 44 U/L   Alkaline Phosphatase 46 38 - 126 U/L   Total Bilirubin 0.7 0.3 - 1.2 mg/dL   GFR, Estimated >09 >60 mL/min    Comment: (NOTE) Calculated  using the CKD-EPI Creatinine Equation (2021)    Anion gap 9 5 - 15    Comment: Performed at [facility], [address] [place], [place] [address]  CBC     Status: Abnormal   Collection Time: 11/21/22  6:46 PM  Result Value Ref Range   WBC 4.6 4.0 - 10.5 K/uL   RBC 4.10 3.87 - 5.11 MIL/uL   Hemoglobin 12.5 12.0 - 15.0 g/dL   HCT 81.1 91.4 - 78.2 %   MCV 90.5 80.0 - 100.0 fL   MCH 30.5 26.0 - 34.0 pg   MCHC 33.7 30.0 - 36.0 g/dL   RDW 95.6 21.3 - 08.6 %   Platelets 148 (L) 150 - 400 K/uL   nRBC 0.0 0.0 - 0.2 %    Comment: Performed at [facility], [address] [place], [place] [address]  Magnesium     Status: Abnormal   Collection Time: 11/21/22  6:46 PM  Result Value Ref Range   Magnesium 1.6 (L) 1.7 - 2.4 mg/dL    Comment: Performed at [facility], [address], [place], [place] [address]  Phosphorus     Status: None   Collection Time: 11/21/22  6:46 PM  Result Value Ref Range   Phosphorus 2.5 2.5 - 4.6 mg/dL    Comment: Performed at [facility], [address] [place], [place] [address]   CT ABDOMEN PELVIS W CONTRAST  Result Date: 11/21/2022 CLINICAL DATA:  Diarrhea and recent vomiting, initial encounter EXAM: CT ABDOMEN AND PELVIS WITH CONTRAST TECHNIQUE: Multidetector CT imaging of the abdomen and pelvis was performed using the standard protocol following bolus administration of intravenous contrast. RADIATION DOSE REDUCTION: This exam was performed according to the departmental dose-optimization program which includes automated exposure control, adjustment of the mA and/or kV according to patient size and/or use of iterative reconstruction technique. CONTRAST:  OMNIPAQUE IOHEXOL 300 MG/ML  SOLN COMPARISON:  None Available. FINDINGS: Lower chest: No acute abnormality. Hepatobiliary: No focal liver abnormality is seen. No gallstones, gallbladder wall thickening, or biliary dilatation.  Pancreas: Unremarkable. No pancreatic ductal dilatation or surrounding inflammatory changes. Spleen: Normal in size without focal abnormality. Adrenals/Urinary Tract: Adrenal glands are within normal limits. Kidneys demonstrate a normal enhancement pattern bilaterally. No renal calculi or obstructive changes are seen. Normal excretion is noted on delayed images. Bladder is decompressed. Stomach/Bowel: Mild hyperemia is noted within the colon which may represent a generalized colitis. No obstructive changes are seen. Mild fluid is noted consistent with the diarrheal state. The appendix is not well visualized and  may have been surgically removed. No inflammatory changes are seen to suggest appendicitis. Small bowel and stomach are within normal limits. Vascular/Lymphatic: Aortic atherosclerosis. No enlarged abdominal or pelvic lymph nodes. Reproductive: Status post hysterectomy. No adnexal masses. Other: No abdominal wall hernia or abnormality. No abdominopelvic ascites. Musculoskeletal: Left hip replacement is noted. Degenerative changes of lumbar spine are seen. IMPRESSION: Mild hyperemia in the colon which may represent a generalized colitis. No obstructive changes are seen. No other focal abnormality is noted. Electronically Signed   By: Alcide Clever M.D.   On: 11/21/2022 19:52    Pending Labs Unresulted Labs (From admission, onward)     Start     Ordered   11/21/22 2339  Gastrointestinal Panel by PCR , Stool  (Gastrointestinal Panel by PCR, Stool                                                                                                                                                     **Does Not include CLOSTRIDIUM DIFFICILE testing. **If CDIFF testing is needed, place order from the "C Difficile Testing" order set.**)  Once,   URGENT        11/21/22 2338   11/21/22 1905  C Difficile Quick Screen w PCR reflex  (C Difficile quick screen w PCR reflex panel )  Once, for 24 hours,   URGENT        References:    CDiff Information Tool   11/21/22 1904   11/21/22 1842  Urinalysis, Routine w reflex microscopic -Urine, Clean Catch  Once,   URGENT       Question:  Specimen Source  Answer:  Urine, Clean Catch   11/21/22 1841            Vitals/Pain Today's Vitals   11/21/22 2131 11/21/22 2200 11/21/22 2300 11/21/22 2327  BP:  (!) 113/43 (!) 104/44   Pulse:  73 83   Resp:  18 (!) 22   Temp:    (!) 101 F (38.3 C)  TempSrc:    Oral  SpO2: (S) 93% 98% 100%   Weight:      Height:      PainSc:        Isolation Precautions Enteric precautions (UV disinfection)  Medications Medications  sodium chloride 0.9 % bolus 1,000 mL (0 mLs Intravenous Stopped 11/21/22 2125)  ondansetron (ZOFRAN) injection 4 mg (4 mg Intravenous Given 11/21/22 1921)  iohexol (OMNIPAQUE) 300 MG/ML solution 100 mL (100 mLs Intravenous Contrast Given 11/21/22 1938)  magnesium sulfate (IV Push/IM) injection 1 g (1 g Intravenous Given 11/21/22 2014)  acetaminophen (TYLENOL) tablet 650 mg (650 mg Oral Given 11/21/22 2344)    Mobility walks with person assist     Focused Assessments Alert and Oriented.    R Recommendations: See Admitting Provider Note  Report given to:  Additional Notes: Needs additional stool sample for PCR if possible

## 2022-11-22 NOTE — Telephone Encounter (Addendum)
I was notified by the pharmacy staff that the patient C. difficile stool sample tested positive.  She was seen in ED yesterday and treated and evaluated for colitis.  Based on this fact of called in a vancomycin prescription 125 mg 4 times daily for 10 days.  I am having charge nurse check with the patient regarding overall her symptoms.  Low threshold to return to the ED if she is feeling dehydrated or worse.  Disregard- patient is admitted to hospital already

## 2022-11-22 NOTE — H&P (Signed)
History and Physical      Anne [name] ZOX:[id number] DOB: January 26, 1941 DOA: 11/21/2022; DOS: 11/22/2022  PCP: Gweneth Dimitri, MD  Patient coming from: home   I have personally briefly reviewed patient's old medical records in [facility]  Chief Complaint: Diarrhea  HPI: Anne [name] is a 82 y.o. female with medical history significant for generalized anxiety disorder, urinary incontinence, who is admitted to [facility] on 11/21/2022 by way of transfer from [facility] with suspected infectious diarrhea resulting in infectious the latter facility complaining of colitis after presenting from home to diarrhea.  The patient reports 4 days of loose watery stool, in the absence of any associated melena or hematochezia.  She conveys that she has been experiencing these episodes of loose stool at a frequency of approximately 10 episodes per day over that timeframe.  Most recent episodes of loose stool occurred in [facility] emergency department earlier today.  She notes that this has been associated with some generalized crampy abdominal discomfort.  She also notes intermittent nausea resulting in 1-2 episodes of nonbloody, nonbilious emesis over that timeframe as well as consequential decline in oral intake over the last few days.  She also reports associated fever, as well as generalized myalgias, but denies associated chills or full body rigors.  No recent traveling, however she does report recently completed course of antibiotics as treatment for urinary tract infection with ensuing resolution of her preceding dysuria.  Denies any recent chest pain, palpitations, diaphoresis, shortness of breath, rash, neck stiffness, headache.  Not on any PPI as an outpatient.  She denies any known sick contacts.    [facility] ED Course:  Vital signs in the ED were notable for the following: Temperature max 101; heart rate is in the range of 60s to 80s; systolic blood pressures in the low  100s 120s; respiratory rate 14-18, oxygen saturation 94 to 100% on room air.  Labs were notable for the following: CMP notable for potassium 4.2, bicarbonate 27, creatinine 0.3 compared to most recent prior serum creatinine data point of 0.71 in February 2015, liver enzymes within normal limits.  Lipase less than 10.  Serum magnesium level 1.6.  CBC notable for blood cell count 4600, hemoglobin 12.5.  Urinalysis has been ordered, with result currently pending.  C. difficile PCR as well as GI panel by PCR both been ordered, with results currently pending.  Per my interpretation, EKG in ED demonstrated the following: Sinus rhythm with heart rate 80, normal intervals, no evidence of T wave or ST changes, including no evidence of ST elevation.  Imaging in the ED, per corresponding formal radiology read, was notable for the following: CT abdomen/pelvis with contrast showed mild generalized hyperemia of the colon suggestive of generalized colitis in the absence of any associated obstruction, abscess, or bowel perforation.  While in the ED, the following were administered: Acetaminophen 650 mg p.o. x 1 dose, magnesium sulfate 1 g IV over 1 hour, Zofran 4 mg IV x 1, normal saline x 1 L bolus.  Subsequently, the patient was admitted to [facility] for further evaluation management of concern for infectious diarrhea resulting in infectious colitis, with presenting labs notable for mild hypomagnesemia.     Review of Systems: As per HPI otherwise 10 point review of systems negative.   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 She has never used smokeless tobacco. She reports current alcohol use of about 7.0 standard drinks of alcohol per week. She reports that she does not use drugs.   Allergies  Allergen Reactions   Sulfa Antibiotics Nausea Only    Family History  Problem Relation Age of Onset   Breast cancer Mother 17   Alzheimer's disease Mother    Breast cancer Maternal Aunt 40   Alzheimer's disease Maternal Aunt    Breast cancer Maternal Aunt 40   Alzheimer's disease Maternal Aunt    Vascular Disease Maternal Uncle        Vascular dementia presentation   Dementia Maternal Uncle     Family history reviewed and not pertinent    Prior to Admission medications   Medication Sig Start Date End Date Taking? Authorizing Provider  vancomycin (VANCOCIN) 125 MG capsule Take 1 capsule (125 mg total) by mouth 4 (four) times daily. Please only take this antibiotic, if you have C. difficile.  Please check your MyChart for this.  If you do not have C. difficile do not take this antibiotic. 11/21/22  Yes Small, Brooke L, PA  calcium carbonate (OS-CAL - DOSED IN MG OF ELEMENTAL CALCIUM) 1250 (500 Ca) MG tablet Take 1 tablet by mouth. Patient not taking: Reported on 10/14/2020    [provider]  Cholecalciferol (VITAMIN D-3) 25 MCG (1000 UT) CAPS Take 1 tablet by mouth daily.    [provider]  hyoscyamine (LEVSIN SL) 0.125 MG SL tablet Place 0.125 mg under the tongue every 4 (four) hours  as needed for cramping.    [provider]  Multiple Vitamin (MULTIVITAMIN WITH MINERALS) TABS tablet Take 1 tablet by mouth daily.    [provider]  MYRBETRIQ 50 MG TB24 tablet  09/12/19   [provider]  sertraline (ZOLOFT) 100 MG tablet Take 1 and 1/2 tablet daily 07/25/20   Van Clines, MD     Objective    Physical Exam: Vitals:   11/21/22 2300 11/21/22 2327 11/22/22 0057 11/22/22 0126  BP: (!) 104/44   (!) 122/56  Pulse: 83   65  Resp: (!) 22   18  Temp:  (!) 101 F (38.3 C) 98.3 F (36.8 C) 98.8 F (37.1 C)  TempSrc:  Oral Oral Oral  SpO2: 100%   96%  Weight:      Height:        General: appears to be stated age; alert, oriented Skin: warm, dry, no rash Head:  AT/[place] Mouth:  Oral mucosa membranes appear dry, normal dentition Neck: supple; trachea midline Heart:  RRR; did not appreciate any M/R/G Lungs: CTAB, did  not appreciate any wheezes, rales, or rhonchi Abdomen: Hyperactive bowel sounds noted, soft, ND, mild generalized tenderness to palpation, in the absence of any associated guarding, rigidity or rebound tenderness. Vascular: 2+ pedal pulses b/l; 2+ radial pulses b/l Extremities: no peripheral edema, no muscle wasting Neuro: strength and sensation intact in upper and lower extremities b/l      Labs on Admission: I have personally reviewed following labs and imaging studies  CBC: Recent Labs  Lab 11/21/22 1846  WBC 4.6  HGB 12.5  HCT 37.1  MCV 90.5  PLT 148*   Basic Metabolic Panel: Recent Labs  Lab 11/21/22 1846  NA 134*  K 4.2  CL 98  CO2 27  GLUCOSE 121*  BUN 12  CREATININE 0.83  CALCIUM 8.8*  MG 1.6*  PHOS 2.5   GFR: Estimated Creatinine Clearance: 50.6 mL/min (by C-G formula based on SCr of 0.83 mg/dL). Liver Function Tests: Recent Labs  Lab 11/21/22 1846  AST 12*  ALT 10  ALKPHOS 46  BILITOT 0.7  PROT 6.6  ALBUMIN 3.8   Recent Labs  Lab 11/21/22 1846  LIPASE <10*   No results for  input(s): "AMMONIA" in the last 168 hours. Coagulation Profile: No results for input(s): "INR", "PROTIME" in the last 168 hours. Cardiac Enzymes: No results for input(s): "CKTOTAL", "CKMB", "CKMBINDEX", "TROPONINI" in the last 168 hours. BNP (last 3 results) No results for input(s): "PROBNP" in the last 8760 hours. HbA1C: No results for input(s): "HGBA1C" in the last 72 hours. CBG: No results for input(s): "GLUCAP" in the last 168 hours. Lipid Profile: No results for input(s): "CHOL", "HDL", "LDLCALC", "TRIG", "CHOLHDL", "LDLDIRECT" in the last 72 hours. Thyroid Function Tests: No results for input(s): "TSH", "T4TOTAL", "FREET4", "T3FREE", "THYROIDAB" in the last 72 hours. Anemia Panel: No results for input(s): "VITAMINB12", "FOLATE", "FERRITIN", "TIBC", "IRON", "RETICCTPCT" in the last 72 hours. Urine analysis:    Component Value Date/Time   COLORURINE YELLOW 06/29/2013 0841   APPEARANCEUR CLEAR 06/29/2013 0841   LABSPEC 1.022 06/29/2013 0841   PHURINE 6.0 06/29/2013 0841   GLUCOSEU NEGATIVE 06/29/2013 0841   HGBUR NEGATIVE 06/29/2013 0841   BILIRUBINUR NEGATIVE 06/29/2013 0841   KETONESUR NEGATIVE 06/29/2013 0841   PROTEINUR NEGATIVE 06/29/2013 0841   UROBILINOGEN 0.2 06/29/2013 0841   NITRITE NEGATIVE 06/29/2013 0841   LEUKOCYTESUR SMALL (A) 06/29/2013 0841    Radiological Exams on Admission: CT ABDOMEN PELVIS W CONTRAST  Result Date: 11/21/2022 CLINICAL DATA:  Diarrhea and recent vomiting, initial encounter EXAM: CT ABDOMEN AND PELVIS WITH CONTRAST TECHNIQUE: Multidetector CT imaging of the abdomen and pelvis was performed using the standard protocol following bolus administration of intravenous contrast. RADIATION DOSE REDUCTION: This exam was performed according to the departmental dose-optimization program which includes automated exposure control, adjustment of the mA and/or kV according to patient size and/or use of iterative reconstruction technique. CONTRAST:   OMNIPAQUE IOHEXOL 300 MG/ML  SOLN COMPARISON:  None Available. FINDINGS: Lower chest: No acute abnormality. Hepatobiliary: No focal liver abnormality is seen. No gallstones, gallbladder wall thickening, or biliary dilatation. Pancreas: Unremarkable. No pancreatic ductal dilatation or surrounding inflammatory changes. Spleen: Normal in size without focal abnormality. Adrenals/Urinary Tract: Adrenal glands are within normal limits. Kidneys demonstrate a normal enhancement pattern bilaterally. No renal calculi or obstructive changes are seen. Normal excretion is noted on delayed images. Bladder is decompressed. Stomach/Bowel: Mild hyperemia is noted within the colon which may represent a generalized colitis. No obstructive changes are seen. Mild fluid is noted consistent with the  diarrheal state. The appendix is not well visualized and may have been surgically removed. No inflammatory changes are seen to suggest appendicitis. Small bowel and stomach are within normal limits. Vascular/Lymphatic: Aortic atherosclerosis. No enlarged abdominal or pelvic lymph nodes. Reproductive: Status post hysterectomy. No adnexal masses. Other: No abdominal wall hernia or abnormality. No abdominopelvic ascites. Musculoskeletal: Left hip replacement is noted. Degenerative changes of lumbar spine are seen. IMPRESSION: Mild hyperemia in the colon which may represent a generalized colitis. No obstructive changes are seen. No other focal abnormality is noted. Electronically Signed   By: Alcide Clever M.D.   On: 11/21/2022 19:52      Assessment/Plan    Principal Problem:   Infectious diarrhea in adult patient Active Problems:   GAD (generalized anxiety disorder)   Hypomagnesemia   Urinary incontinence    #) infectious colitis: Suspected in the setting of 4 days of loose watery stool occurring at a frequency of approximately 10 episodes per day over that timeframe, in the absence of any associated melena or hematochezia, with  presentation also notable for hyperactive bowel sounds, and crampy abdominal discomfort as well as objective fever over that timeframe, with CT abdomen/pelvis showing generalized hyperemia of the colon consistent with generalized colitis in a pattern that could be consistent with infectious colitis.  In the absence of any associated hematochezia, ischemic colitis appears less likely, including via the distribution of colitis on CT scan.  No known history of underlying inflammatory bowel disease.  No history of underlying autoimmune disorders.  No recent travel, although she is at risk for C. difficile given recent completion of antibiotic therapy for urinary tract infection.  Of note, C. difficile PCR as well as GI panel by PCR are currently pending.  Will refrain from antimotility agents pending this infectious evaluation.  While she does have an objective fever, there are no additional SIRS criteria met at this time to meet criteria for sepsis.  Additionally, she appears hemodynamically stable.  Therefore, will refrain from initiation of IV antibiotics at this time.  Plan: Monitor for results of C. difficile PCR as well as GI panel by PCR, as above.  Refraining from antimotility agents for now.  Continuous lactated Ringer's at 100 cc/h x 10 hours.  Repeat CMP, CBC in the morning.  Further evaluation management of presenting hypomagnesemia, as further detailed below.  Prn IV Zofran.  As needed IV Dilaudid.  As needed acetaminophen for fever.  Monitor strict I's and O's and daily weights.               #) Hypomagnesemia: presenting serum mag level noted to be 1.6.  This is in the context of recent increase in GI losses in the form of presenting diarrhea, as above.  She is status post receipt of 1 g of IV magnesium sulfate administered at [facility] earlier today.  Plan:  Monitor on tele. Repeat serum mag level in the AM.  Further evaluation management of presenting diarrhea, as  above.                  #) Generalized anxiety disorder: documented h/o such. On Zoloft as outpatient.  In the setting of presenting diarrhea, will hold home Zoloft for now.  Plan: Holding home Zoloft for now.                #) Urinary incontinence: Documented history of such, for which she is on Myrbetriq as an outpatient.  In the setting of her profuse diarrhea, will  hold Myrbetriq for now, although this medication may actually offer some anticholinergic benefit to the frequency of her bowel movements.  Plan: Holding home Myrbetriq for now.  Monitor strict I's and O's and daily weights.  CMP in the morning.      DVT prophylaxis: SCD's   Code Status: Full code Family Communication: none Disposition Plan: Per Rounding Team Consults called: none;  Admission status: obs    I SPENT GREATER THAN 75  MINUTES IN CLINICAL CARE TIME/MEDICAL DECISION-MAKING IN COMPLETING THIS ADMISSION.     Chaney Born Leisel Pinette DO [facility] From 7PM - 7AM   11/22/2022, 1:44 AM

## 2022-11-22 NOTE — ED Notes (Signed)
Patient transported to [place] via [facility] at this time °

## 2022-11-22 NOTE — Hospital Course (Signed)
82 y.o. female with medical history significant for generalized anxiety disorder, urinary incontinence, who presented to [facility] with complaints of diarrhea.   Upon evaluation in the emergency department CT imaging of the abdomen pelvis revealed generalized hyperemia of the colon suggestive of colitis.  Considering ongoing symptoms the hospitalist group was called and patient was accepted for transfer to [facility] for continued medical care and management of suspected infectious diarrhea.    Patient was found to be C. difficile positive and was initiated on Dificid as well as intravenous volume resuscitation.

## 2022-11-22 NOTE — Progress Notes (Signed)
PROGRESS NOTE   Anne [name]  ZOX:[id number] DOB: 07-28-1940 DOA: 11/21/2022 PCP: Gweneth Dimitri, MD   Date of Service: the patient was seen and examined on 11/23/2022  Brief Narrative:  82 y.o. female with medical history significant for generalized anxiety disorder, urinary incontinence, who presented to [facility] with complaints of diarrhea.   Upon evaluation in the emergency department CT imaging of the abdomen pelvis revealed generalized hyperemia of the colon suggestive of colitis.  Considering ongoing symptoms the hospitalist group was called and patient was accepted for transfer to [facility] for continued medical care and management of suspected infectious diarrhea.     Assessment and Plan: * Clostridioides difficile infection Patient positive for C. Difficile Patient reports taking 2 courses of antibiotics in the past 4 weeks which is likely what prompted this infection After discussing case with pharmacy initiating Dificid Continuing to hydrate patient with intravenous isotonic fluids Monitoring and replacing electrolytes as necessary   Hypomagnesemia Replaced  GAD (generalized anxiety disorder) Resume home regimen of sertraline     Subjective:  Patient complaining of abdominal pain, moderate to severe in intensity, cramping in quality, radiating diffusely, worse with movement.  Physical Exam:  Vitals:   11/22/22 0614 11/22/22 0931 11/22/22 1400 11/22/22 2110  BP: (!) 117/50 117/76  128/65  Pulse: 79 70  69  Resp: 18 20  18   Temp: (!) 101.4 F (38.6 C) 98.8 F (37.1 C) (!) 101.1 F (38.4 C) 98.2 F (36.8 C)  TempSrc: Oral Oral  Oral  SpO2: 92% 95%  93%  Weight:      Height:        Constitutional: Awake alert and oriented x3, no associated distress.   Skin: no rashes, no lesions, poor skin turgor noted. Eyes: Pupils are equally reactive to light.  No evidence of scleral icterus or conjunctival pallor.  ENMT: Dry mucous membranes  noted.  Posterior pharynx clear of any exudate or lesions.   Respiratory: clear to auscultation bilaterally, no wheezing, no crackles. Normal respiratory effort. No accessory muscle use.  Cardiovascular: Regular rate and rhythm, no murmurs / rubs / gallops. No extremity edema. 2+ pedal pulses. No carotid bruits.  Abdomen: Abdomen is soft with right-sided abdominal tenderness.  No evidence of intra-abdominal masses.  Hyperactive bowel sounds noted in all quadrants.   Musculoskeletal: No joint deformity upper and lower extremities. Good ROM, no contractures. Normal muscle tone.    Data Reviewed:  I have personally reviewed and interpreted labs, imaging.  Significant findings are   CBC: Recent Labs  Lab 11/21/22 1846 11/22/22 0510  WBC 4.6 6.7  NEUTROABS  --  4.3  HGB 12.5 12.0  HCT 37.1 37.6  MCV 90.5 96.2  PLT 148* 147*   Basic Metabolic Panel: Recent Labs  Lab 11/21/22 1846 11/22/22 0510  NA 134* 136  K 4.2 4.0  CL 98 100  CO2 27 27  GLUCOSE 121* 119*  BUN 12 11  CREATININE 0.83 0.97  CALCIUM 8.8* 8.2*  MG 1.6* 1.8  PHOS 2.5  --    GFR: Estimated Creatinine Clearance: 43.3 mL/min (by C-G formula based on SCr of 0.97 mg/dL). Liver Function Tests: Recent Labs  Lab 11/21/22 1846 11/22/22 0510  AST 12* 15  ALT 10 14  ALKPHOS 46 47  BILITOT 0.7 0.6  PROT 6.6 6.0*  ALBUMIN 3.8 3.1*      Code Status:  Full code.  Code status decision has been confirmed with: patient Family Communication: Daughter is at  the bedside who has been updated on plan of care.   Severity of Illness:  The appropriate patient status for this patient is INPATIENT. Inpatient status is judged to be reasonable and necessary in order to provide the required intensity of service to ensure the patient's safety. The patient's presenting symptoms, physical exam findings, and initial radiographic and laboratory data in the context of their chronic comorbidities is felt to place them at high risk  for further clinical deterioration. Furthermore, it is not anticipated that the patient will be medically stable for discharge from the hospital within 2 midnights of admission.   * I certify that at the point of admission it is my clinical judgment that the patient will require inpatient hospital care spanning beyond 2 midnights from the point of admission due to high intensity of service, high risk for further deterioration and high frequency of surveillance required.*  Time spent:  51 minutes  Author:  Marinda Elk MD  11/23/2022 12:23 AM

## 2022-11-22 NOTE — TOC Benefit Eligibility Note (Signed)
Pharmacy Patient Advocate Encounter  Insurance verification completed.    The patient is insured through [facility]   Ran test claim for Dificid 200 mg and the current 10 day co-pay is $100.00.  Ran test claim for Vancomycin 125 mg and Requires Prior Authorization    This test claim was processed through [facility]- copay amounts may vary at other pharmacies due to [employer], or as the patient moves through the different stages of their insurance plan.    Roland Earl, CPHT Pharmacy Patient Advocate Specialist [facility] Pharm[facility] Patient Advocate Team Direct Number: [phone]  Fax: [phone]

## 2022-11-22 NOTE — Progress Notes (Signed)
Notified Dr Renaye Rakers and Messaged Dr Leafy Half and primary RN about call received with patient being +c diff toxin.  Primary RN aware and patient already currently on vanco.

## 2022-11-23 LAB — GASTROINTESTINAL PANEL BY PCR, STOOL (REPLACES STOOL CULTURE)

## 2022-11-23 MED ORDER — DICYCLOMINE HCL 20 MG PO TABS: 20.0000 mg | ORAL_TABLET | Freq: Four times a day (QID) | ORAL | Status: DC | PRN

## 2022-11-23 MED ORDER — MORPHINE SULFATE (PF) 2 MG/ML IV SOLN: 2.0000 mg | INTRAVENOUS | Status: DC | PRN

## 2022-11-23 MED ORDER — BANATROL TF EN LIQD
60.0000 mL | Freq: Two times a day (BID) | ENTERAL | Status: DC
  Administered 2022-11-23 – 2022-11-24 (×3): 60 mL via ORAL
  Filled 2022-11-23 (×3): qty 60

## 2022-11-23 MED ORDER — ADULT MULTIVITAMIN W/MINERALS CH
1.0000 | ORAL_TABLET | Freq: Every day | ORAL | Status: DC
  Administered 2022-11-23 – 2022-11-24 (×2): 1 via ORAL
  Filled 2022-11-23 (×2): qty 1

## 2022-11-23 MED ORDER — OXYCODONE HCL 5 MG PO TABS: 5.0000 mg | ORAL_TABLET | ORAL | Status: DC | PRN

## 2022-11-23 MED ORDER — SERTRALINE HCL 50 MG PO TABS
150.0000 mg | ORAL_TABLET | Freq: Every day | ORAL | Status: DC
  Administered 2022-11-23 – 2022-11-24 (×2): 150 mg via ORAL
  Filled 2022-11-23 (×2): qty 1

## 2022-11-23 NOTE — Progress Notes (Signed)
Initial Nutrition Assessment  INTERVENTION:   -Boost Breeze po TID, each supplement provides 250 kcal and 9 grams of protein   -Banatrol fiber supplement BID x 5 days, each provides 45 kcals, 2g protein and 5g soluble fiber.  -Multivitamin with minerals daily  NUTRITION DIAGNOSIS:   Increased nutrient needs related to acute illness (c.diff infection) as evidenced by estimated needs.  GOAL:   Patient will meet greater than or equal to 90% of their needs  MONITOR:   PO intake, Supplement acceptance, Labs, Weight trends, I & O's  REASON FOR ASSESSMENT:   Malnutrition Screening Tool    ASSESSMENT:   82 y.o. female with medical history significant for generalized anxiety disorder, urinary incontinence, who presented to [facility] with complaints of diarrhea. Admitted for c.diff.  Patient unable to give history at time of visit. Was in the bathroom, having another BM, requesting assistance in the bathroom from staff. Pt on soft diet. Consuming mostly liquids at this time. Boost Breeze was ordered. RD to order [facility] to aid diarrhea. Given losses will add daily MVI as well.  Per weight records, weight has decreased since 2022.   Medications reviewed.  Labs reviewed:  C.diff +  NUTRITION - FOCUSED PHYSICAL EXAM:  Unable to complete at this time.   Diet Order:   Diet Order             DIET SOFT Room service appropriate? Yes; Fluid consistency: Thin  Diet effective now                   EDUCATION NEEDS:   No education needs have been identified at this time  Skin:  Skin Assessment: Reviewed RN Assessment  Last BM:  7/1 -type 7  Height:   Ht Readings from Last 1 Encounters:  11/21/22 5\' 6"  (1.676 m)    Weight:   Wt Readings from Last 1 Encounters:  11/21/22 66.5 kg    BMI:  Body mass index is 23.66 kg/m.  Estimated Nutritional Needs:   Kcal:  1700-1900  Protein:  80-95g  Fluid:  >1.9L/day   Tilda Franco, MS, RD, LDN Inpatient  Clinical Dietitian Contact information available via Amion b

## 2022-11-23 NOTE — Assessment & Plan Note (Signed)
Continuing home regimen of sertraline 

## 2022-11-23 NOTE — Assessment & Plan Note (Signed)
Replaced. °

## 2022-11-23 NOTE — Assessment & Plan Note (Addendum)
Patient positive for C. Difficile Patient reports taking 2 courses of antibiotics in the past 4 weeks which is likely what prompted this infection After discussing case with pharmacy initiating Dificid Continuing to hydrate patient with intravenous isotonic fluids Monitoring and replacing electrolytes as necessary

## 2022-11-23 NOTE — Progress Notes (Signed)
PROGRESS NOTE   Anne [name]  ZOX:[id number] DOB: [birth year] DOA: 11/21/2022 PCP: Gweneth Dimitri, MD   Date of Service: the patient was seen and examined on 11/23/2022  Brief Narrative:  82 y.o. female with medical history significant for generalized anxiety disorder, urinary incontinence, who presented to [facility] with complaints of diarrhea.   Upon evaluation in the emergency department CT imaging of the abdomen pelvis revealed generalized hyperemia of the colon suggestive of colitis.  Considering ongoing symptoms the hospitalist group was called and patient was accepted for transfer to [facility] for continued medical care and management of suspected infectious diarrhea.     Assessment and Plan: * Clostridioides difficile infection Patient positive for C. Difficile Patient reports taking 2 courses of antibiotics in the past 4 weeks which is likely what prompted this infection After discussing case with pharmacy initiating Dificid Continuing to hydrate patient with intravenous isotonic fluids Monitoring and replacing electrolytes as necessary   Hypomagnesemia Replaced  GAD (generalized anxiety disorder) Resume home regimen of sertraline     Subjective:  Patient complaining of abdominal pain, moderate to severe in intensity, cramping in quality, radiating diffusely, worse with movement.  Physical Exam:  Vitals:   11/22/22 0931 11/22/22 1400 11/22/22 2110 11/23/22 0701  BP: 117/76  128/65 103/63  Pulse: 70  69 64  Resp: 20  18 18   Temp: 98.8 F (37.1 C) (!) 101.1 F (38.4 C) 98.2 F (36.8 C) 98.3 F (36.8 C)  TempSrc: Oral  Oral Oral  SpO2: 95%  93% 96%  Weight:      Height:        Constitutional: Awake alert and oriented x3, no associated distress.   Skin: no rashes, no lesions, poor skin turgor noted. Eyes: Pupils are equally reactive to light.  No evidence of scleral icterus or conjunctival pallor.  ENMT: Dry mucous membranes noted.   Posterior pharynx clear of any exudate or lesions.   Respiratory: clear to auscultation bilaterally, no wheezing, no crackles. Normal respiratory effort. No accessory muscle use.  Cardiovascular: Regular rate and rhythm, no murmurs / rubs / gallops. No extremity edema. 2+ pedal pulses. No carotid bruits.  Abdomen: Abdomen is soft with right-sided abdominal tenderness.  No evidence of intra-abdominal masses.  Hyperactive bowel sounds noted in all quadrants.   Musculoskeletal: No joint deformity upper and lower extremities. Good ROM, no contractures. Normal muscle tone.    Data Reviewed:  I have personally reviewed and interpreted labs, imaging.  Significant findings are   CBC: Recent Labs  Lab 11/21/22 1846 11/22/22 0510  WBC 4.6 6.7  NEUTROABS  --  4.3  HGB 12.5 12.0  HCT 37.1 37.6  MCV 90.5 96.2  PLT 148* 147*    Basic Metabolic Panel: Recent Labs  Lab 11/21/22 1846 11/22/22 0510  NA 134* 136  K 4.2 4.0  CL 98 100  CO2 27 27  GLUCOSE 121* 119*  BUN 12 11  CREATININE 0.83 0.97  CALCIUM 8.8* 8.2*  MG 1.6* 1.8  PHOS 2.5  --     GFR: Estimated Creatinine Clearance: 43.3 mL/min (by C-G formula based on SCr of 0.97 mg/dL). Liver Function Tests: Recent Labs  Lab 11/21/22 1846 11/22/22 0510  AST 12* 15  ALT 10 14  ALKPHOS 46 47  BILITOT 0.7 0.6  PROT 6.6 6.0*  ALBUMIN 3.8 3.1*       Code Status:  Full code.  Code status decision has been confirmed with: patient Family Communication: Daughter is  at the bedside who has been updated on plan of care.   Severity of Illness:  The appropriate patient status for this patient is INPATIENT. Inpatient status is judged to be reasonable and necessary in order to provide the required intensity of service to ensure the patient's safety. The patient's presenting symptoms, physical exam findings, and initial radiographic and laboratory data in the context of their chronic comorbidities is felt to place them at high risk for  further clinical deterioration. Furthermore, it is not anticipated that the patient will be medically stable for discharge from the hospital within 2 midnights of admission.   * I certify that at the point of admission it is my clinical judgment that the patient will require inpatient hospital care spanning beyond 2 midnights from the point of admission due to high intensity of service, high risk for further deterioration and high frequency of surveillance required.*  Time spent:  51 minutes  Author:  Marinda Elk MD  11/23/2022 10:02 AM

## 2022-11-24 ENCOUNTER — Other Ambulatory Visit (HOSPITAL_COMMUNITY): Payer: Self-pay

## 2022-11-24 DIAGNOSIS — A498 Other bacterial infections of unspecified site: Secondary | ICD-10-CM | POA: Diagnosis not present

## 2022-11-24 LAB — CBC WITH DIFFERENTIAL/PLATELET
Abs Immature Granulocytes: 0.08 10*3/uL — ABNORMAL HIGH (ref 0.00–0.07)
Basophils Absolute: 0 10*3/uL (ref 0.0–0.1)
Basophils Relative: 1 %
Eosinophils Absolute: 0.2 10*3/uL (ref 0.0–0.5)
Eosinophils Relative: 4 %
HCT: 35.4 % — ABNORMAL LOW (ref 36.0–46.0)
Hemoglobin: 11.4 g/dL — ABNORMAL LOW (ref 12.0–15.0)
Immature Granulocytes: 2 %
Lymphocytes Relative: 14 %
Lymphs Abs: 0.7 10*3/uL (ref 0.7–4.0)
MCH: 30.2 pg (ref 26.0–34.0)
MCHC: 32.2 g/dL (ref 30.0–36.0)
MCV: 93.7 fL (ref 80.0–100.0)
Monocytes Absolute: 0.6 10*3/uL (ref 0.1–1.0)
Monocytes Relative: 13 %
Neutro Abs: 3.2 10*3/uL (ref 1.7–7.7)
Neutrophils Relative %: 66 %
Platelets: 161 10*3/uL (ref 150–400)
RBC: 3.78 MIL/uL — ABNORMAL LOW (ref 3.87–5.11)
RDW: 13 % (ref 11.5–15.5)
WBC: 4.9 10*3/uL (ref 4.0–10.5)
nRBC: 0 % (ref 0.0–0.2)

## 2022-11-24 LAB — COMPREHENSIVE METABOLIC PANEL
ALT: 12 U/L (ref 0–44)
AST: 13 U/L — ABNORMAL LOW (ref 15–41)
Albumin: 2.7 g/dL — ABNORMAL LOW (ref 3.5–5.0)
Alkaline Phosphatase: 41 U/L (ref 38–126)
Anion gap: 8 (ref 5–15)
BUN: 8 mg/dL (ref 8–23)
CO2: 26 mmol/L (ref 22–32)
Calcium: 7.6 mg/dL — ABNORMAL LOW (ref 8.9–10.3)
Chloride: 99 mmol/L (ref 98–111)
Creatinine, Ser: 0.67 mg/dL (ref 0.44–1.00)
GFR, Estimated: >60 mL/min (ref >60)
Glucose, Bld: 101 mg/dL — ABNORMAL HIGH (ref 70–99)
Potassium: 3 mmol/L — ABNORMAL LOW (ref 3.5–5.1)
Sodium: 133 mmol/L — ABNORMAL LOW (ref 135–145)
Total Bilirubin: 0.5 mg/dL (ref 0.3–1.2)
Total Protein: 5.3 g/dL — ABNORMAL LOW (ref 6.5–8.1)

## 2022-11-24 LAB — MAGNESIUM: Magnesium: 2 mg/dL (ref 1.7–2.4)

## 2022-11-24 MED ORDER — ACETAMINOPHEN 325 MG PO TABS: 650.0000 mg | ORAL_TABLET | Freq: Four times a day (QID) | ORAL | Status: AC | PRN

## 2022-11-24 MED ORDER — DICYCLOMINE HCL 20 MG PO TABS
20.0000 mg | ORAL_TABLET | Freq: Four times a day (QID) | ORAL | 0 refills | Status: AC | PRN
  Filled 2022-11-24: qty 15, 4d supply, fill #0

## 2022-11-24 MED ORDER — FIDAXOMICIN 200 MG PO TABS
200.0000 mg | ORAL_TABLET | Freq: Two times a day (BID) | ORAL | 0 refills | Status: AC
  Filled 2022-11-24: qty 17, 9d supply, fill #0

## 2022-11-24 MED ORDER — POTASSIUM CHLORIDE CRYS ER 20 MEQ PO TBCR
40.0000 meq | EXTENDED_RELEASE_TABLET | ORAL | Status: AC
  Administered 2022-11-24 (×2): 40 meq via ORAL
  Filled 2022-11-24 (×2): qty 2

## 2022-11-24 NOTE — Discharge Summary (Signed)
Physician Discharge Summary  Anne [name] GNF:[id number] DOB: 05-23-1941 DOA: 11/21/2022  PCP: Gweneth Dimitri, MD  Admit date: 11/21/2022 Discharge date: 11/24/2022 Discharging to: home     Discharge Diagnoses:   Principal Problem:   Clostridioides difficile infection Active Problems:   GAD (generalized anxiety disorder)   Hypomagnesemia      Hospital Course:  This is an 82 year old female with anxiety disorder and urinary incontinence who presented to the hospital for numerous episodes of diarrhea along with intermittent nausea and vomiting.  In the ED, she was noted to have a temperature of 101 and abdominal tenderness on exam.  She stated that she had just finished a 7-day course of Augmentin for UTI. She tested positive for C. difficile colitis and was started on Dificid.  Principal Problem:   Clostridioides difficile infection -She has noted significant improvement in diarrhea. - She is has occasional cramping but this is not as severe as when she was first admitted - She is tolerating a regular diet - Will discharge home to complete the course of Dificid  Active Problems: Mild thrombocytopenia - Platelets were mildly low at 147 and 148 and have improved to normal today    Hypomagnesemia and hypokalemia - Magnesium was 1.6 and has normalized with replacement - Potassium was 3.0 and is being replaced today prior to discharge     Discharge Instructions  Discharge Instructions     Diet - low sodium heart healthy   Complete by: As directed    Increase activity slowly   Complete by: As directed       Allergies as of 11/24/2022       Reactions   Sulfa Antibiotics Nausea Only        Medication List     TAKE these medications    acetaminophen 325 MG tablet Commonly known as: TYLENOL Take 2 tablets (650 mg total) by mouth every 6 (six) hours as needed for mild pain (or Fever >/= 101).   calcium carbonate 1250 (500 Ca) MG tablet Commonly known as:  OS-CAL - dosed in mg of elemental calcium Take 1 tablet by mouth daily with breakfast.   dicyclomine 20 MG tablet Commonly known as: BENTYL Take 1 tablet (20 mg total) by mouth every 6 (six) hours as needed for spasms.   fidaxomicin 200 MG Tabs tablet Commonly known as: DIFICID Take 1 tablet (200 mg total) by mouth 2 (two) times daily.   multivitamin with minerals Tabs tablet Take 1 tablet by mouth daily.   Myrbetriq 50 MG Tb24 tablet Generic drug: mirabegron ER Take 50 mg by mouth daily.   ondansetron 4 MG tablet Commonly known as: ZOFRAN Take 4 mg by mouth 2 (two) times daily as needed for nausea or vomiting.   sertraline 100 MG tablet Commonly known as: ZOLOFT Take 1 and 1/2 tablet daily   Vitamin D-3 25 MCG (1000 UT) Caps Take 1,000 Units by mouth daily.            The results of significant diagnostics from this hospitalization (including imaging, microbiology, ancillary and laboratory) are listed below for reference.    CT ABDOMEN PELVIS W CONTRAST  Result Date: 11/21/2022 CLINICAL DATA:  Diarrhea and recent vomiting, initial encounter EXAM: CT ABDOMEN AND PELVIS WITH CONTRAST TECHNIQUE: Multidetector CT imaging of the abdomen and pelvis was performed using the standard protocol following bolus administration of intravenous contrast. RADIATION DOSE REDUCTION: This exam was performed according to the departmental dose-optimization program which includes automated exposure control, adjustment  of the mA and/or kV according to patient size and/or use of iterative reconstruction technique. CONTRAST:  OMNIPAQUE IOHEXOL 300 MG/ML  SOLN COMPARISON:  None Available. FINDINGS: Lower chest: No acute abnormality. Hepatobiliary: No focal liver abnormality is seen. No gallstones, gallbladder wall thickening, or biliary dilatation. Pancreas: Unremarkable. No pancreatic ductal dilatation or surrounding inflammatory changes. Spleen: Normal in size without focal abnormality.  Adrenals/Urinary Tract: Adrenal glands are within normal limits. Kidneys demonstrate a normal enhancement pattern bilaterally. No renal calculi or obstructive changes are seen. Normal excretion is noted on delayed images. Bladder is decompressed. Stomach/Bowel: Mild hyperemia is noted within the colon which may represent a generalized colitis. No obstructive changes are seen. Mild fluid is noted consistent with the diarrheal state. The appendix is not well visualized and may have been surgically removed. No inflammatory changes are seen to suggest appendicitis. Small bowel and stomach are within normal limits. Vascular/Lymphatic: Aortic atherosclerosis. No enlarged abdominal or pelvic lymph nodes. Reproductive: Status post hysterectomy. No adnexal masses. Other: No abdominal wall hernia or abnormality. No abdominopelvic ascites. Musculoskeletal: Left hip replacement is noted. Degenerative changes of lumbar spine are seen. IMPRESSION: Mild hyperemia in the colon which may represent a generalized colitis. No obstructive changes are seen. No other focal abnormality is noted. Electronically Signed   By: Alcide Clever M.D.   On: 11/21/2022 19:52   Labs:   Basic Metabolic Panel: Recent Labs  Lab 11/21/22 1846 11/22/22 0510 11/24/22 0616  NA 134* 136 133*  K 4.2 4.0 3.0*  CL 98 100 99  CO2 27 27 26   GLUCOSE 121* 119* 101*  BUN 12 11 8   CREATININE 0.83 0.97 0.67  CALCIUM 8.8* 8.2* 7.6*  MG 1.6* 1.8 2.0  PHOS 2.5  --   --      CBC: Recent Labs  Lab 11/21/22 1846 11/22/22 0510 11/24/22 0616  WBC 4.6 6.7 4.9  NEUTROABS  --  4.3 3.2  HGB 12.5 12.0 11.4*  HCT 37.1 37.6 35.4*  MCV 90.5 96.2 93.7  PLT 148* 147* 161         SIGNED:   Calvert Cantor, MD  [facility] 11/24/2022, 10:53 AM

## 2022-11-24 NOTE — Progress Notes (Signed)
Patient discharged to home, discharge instructions reviewed with patient and daughter who verbalized understanding.

## 2022-12-01 DIAGNOSIS — N3281 Overactive bladder: Secondary | ICD-10-CM | POA: Diagnosis not present

## 2022-12-01 DIAGNOSIS — A498 Other bacterial infections of unspecified site: Secondary | ICD-10-CM | POA: Diagnosis not present

## 2022-12-01 DIAGNOSIS — E876 Hypokalemia: Secondary | ICD-10-CM | POA: Diagnosis not present

## 2022-12-01 DIAGNOSIS — F419 Anxiety disorder, unspecified: Secondary | ICD-10-CM | POA: Diagnosis not present

## 2022-12-01 DIAGNOSIS — Z09 Encounter for follow-up examination after completed treatment for conditions other than malignant neoplasm: Secondary | ICD-10-CM | POA: Diagnosis not present

## 2022-12-01 DIAGNOSIS — Z6823 Body mass index (BMI) 23.0-23.9, adult: Secondary | ICD-10-CM | POA: Diagnosis not present

## 2022-12-07 DIAGNOSIS — R350 Frequency of micturition: Secondary | ICD-10-CM | POA: Diagnosis not present

## 2022-12-07 DIAGNOSIS — N3020 Other chronic cystitis without hematuria: Secondary | ICD-10-CM | POA: Diagnosis not present

## 2022-12-07 DIAGNOSIS — R3915 Urgency of urination: Secondary | ICD-10-CM | POA: Diagnosis not present

## 2022-12-24 DIAGNOSIS — Z03818 Encounter for observation for suspected exposure to other biological agents ruled out: Secondary | ICD-10-CM | POA: Diagnosis not present

## 2022-12-24 DIAGNOSIS — R0989 Other specified symptoms and signs involving the circulatory and respiratory systems: Secondary | ICD-10-CM | POA: Diagnosis not present

## 2022-12-24 DIAGNOSIS — A0472 Enterocolitis due to Clostridium difficile, not specified as recurrent: Secondary | ICD-10-CM | POA: Diagnosis not present

## 2022-12-24 DIAGNOSIS — Z8619 Personal history of other infectious and parasitic diseases: Secondary | ICD-10-CM | POA: Diagnosis not present

## 2022-12-24 DIAGNOSIS — R197 Diarrhea, unspecified: Secondary | ICD-10-CM | POA: Diagnosis not present

## 2022-12-24 DIAGNOSIS — R519 Headache, unspecified: Secondary | ICD-10-CM | POA: Diagnosis not present

## 2022-12-27 DIAGNOSIS — R0989 Other specified symptoms and signs involving the circulatory and respiratory systems: Secondary | ICD-10-CM | POA: Diagnosis not present

## 2022-12-27 DIAGNOSIS — R197 Diarrhea, unspecified: Secondary | ICD-10-CM | POA: Diagnosis not present

## 2022-12-27 DIAGNOSIS — Z8619 Personal history of other infectious and parasitic diseases: Secondary | ICD-10-CM | POA: Diagnosis not present

## 2023-01-20 DIAGNOSIS — A498 Other bacterial infections of unspecified site: Secondary | ICD-10-CM | POA: Diagnosis not present

## 2023-02-25 DIAGNOSIS — Z87892 Personal history of anaphylaxis: Secondary | ICD-10-CM | POA: Diagnosis not present

## 2023-02-25 DIAGNOSIS — J301 Allergic rhinitis due to pollen: Secondary | ICD-10-CM | POA: Diagnosis not present

## 2023-02-25 DIAGNOSIS — Z8249 Family history of ischemic heart disease and other diseases of the circulatory system: Secondary | ICD-10-CM | POA: Diagnosis not present

## 2023-02-25 DIAGNOSIS — F325 Major depressive disorder, single episode, in full remission: Secondary | ICD-10-CM | POA: Diagnosis not present

## 2023-02-25 DIAGNOSIS — Z809 Family history of malignant neoplasm, unspecified: Secondary | ICD-10-CM | POA: Diagnosis not present

## 2023-02-25 DIAGNOSIS — S83412A Sprain of medial collateral ligament of left knee, initial encounter: Secondary | ICD-10-CM | POA: Diagnosis not present

## 2023-02-25 DIAGNOSIS — H3530 Unspecified macular degeneration: Secondary | ICD-10-CM | POA: Diagnosis not present

## 2023-02-25 DIAGNOSIS — F411 Generalized anxiety disorder: Secondary | ICD-10-CM | POA: Diagnosis not present

## 2023-02-25 DIAGNOSIS — M1990 Unspecified osteoarthritis, unspecified site: Secondary | ICD-10-CM | POA: Diagnosis not present

## 2023-02-25 DIAGNOSIS — N3941 Urge incontinence: Secondary | ICD-10-CM | POA: Diagnosis not present

## 2023-03-09 DIAGNOSIS — R3915 Urgency of urination: Secondary | ICD-10-CM | POA: Diagnosis not present

## 2023-03-09 DIAGNOSIS — R350 Frequency of micturition: Secondary | ICD-10-CM | POA: Diagnosis not present

## 2023-03-09 DIAGNOSIS — N3020 Other chronic cystitis without hematuria: Secondary | ICD-10-CM | POA: Diagnosis not present

## 2023-03-19 DIAGNOSIS — J029 Acute pharyngitis, unspecified: Secondary | ICD-10-CM | POA: Diagnosis not present

## 2023-03-19 DIAGNOSIS — M25562 Pain in left knee: Secondary | ICD-10-CM | POA: Diagnosis not present

## 2023-03-19 DIAGNOSIS — R051 Acute cough: Secondary | ICD-10-CM | POA: Diagnosis not present

## 2023-03-19 DIAGNOSIS — J069 Acute upper respiratory infection, unspecified: Secondary | ICD-10-CM | POA: Diagnosis not present

## 2023-03-19 DIAGNOSIS — Z03818 Encounter for observation for suspected exposure to other biological agents ruled out: Secondary | ICD-10-CM | POA: Diagnosis not present

## 2023-03-21 NOTE — Progress Notes (Unsigned)
DATE OF VISIT: 03/22/2023        Anne [name] DOB: Dec 30, 1940 MRN: [id number]  CC:  Lt knee pain  History- Anne [name] is a 82 y.o.  female for evaluation and treatment of left knee pain Lt knee pain that started after where she needed to climb into backseat of car - had difficulty getting in/out of the car - ongoing pain since that time Seen at [facility] 02/25/23 - review of records shows dx with knee sprain  Had f/u visit to [facility] 03/19/23 - still having Lt knee pain - noted pain 60% improved, but still present - unable to return to high intensity exercise - using Voltaren gel prn - using knee brace prn - was given referral for sports med eval.  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   Strep throat 06/26/2013    Past Surgical History 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   Procedure: LEFT TOTAL HIP ARTHROPLASTY ANTERIOR APPROACH;  Surgeon: Shelda Pal, MD;  Location: [facility];  Service: Orthopedics;  Laterality: Left;    Medications Current Outpatient Medications  Medication Sig Dispense Refill   acetaminophen (TYLENOL) 325 MG tablet Take 2 tablets (650 mg total) by mouth every 6 (six) hours as needed for mild pain (or Fever >/= 101).     calcium carbonate (OS-CAL - DOSED IN MG OF  ELEMENTAL CALCIUM) 1250 (500 Ca) MG tablet Take 1 tablet by mouth daily with breakfast.     Cholecalciferol (VITAMIN D-3) 25 MCG (1000 UT) CAPS Take 1,000 Units by mouth daily.     dicyclomine (BENTYL) 20 MG tablet Take 1 tablet (20 mg) by mouth every 6 hours as needed for spasms. 15 tablet 0   fidaxomicin (DIFICID) 200 MG TABS tablet Take 1 tablet (200 mg) by mouth 2 times daily. 17 tablet 0   Multiple Vitamin (MULTIVITAMIN WITH MINERALS) TABS tablet Take 1 tablet by mouth daily.     MYRBETRIQ 50 MG TB24 tablet Take 50 mg by mouth daily.     ondansetron (ZOFRAN) 4 MG tablet Take 4 mg by mouth 2 (two) times daily as needed for nausea or vomiting.     sertraline (ZOLOFT) 100 MG tablet Take 1 and 1/2 tablet daily 135 tablet 3   No current facility-administered medications for this visit.    Allergies is allergic to sulfa antibiotics.  Family History - reviewed per EMR and intake form  Social History   reports current alcohol use of about 7.0 standard drinks of alcohol per week.  reports that she has never smoked. She has never used smokeless tobacco.  reports no history of drug use. OCCUPATION: ***  EXAM: Vitals: There were no vitals taken for this visit. General: AOx3, NAD, pleasant SKIN: no rashes or lesions, skin clean, dry, intact MSK: ***  NEURO: sensation intact to light touch, DTR +***/4 *** bilaterally VASC: pulses 2+ and symmetric *** bilaterally, no edema  IMAGING: XRAYS: ***   Assessment & Plan   ASSESSMENT: 1. ***  PLAN: 1. *** ***. The patient will return to see me ***, will call sooner with any questions/concerns.   ***. Patient expressed understanding & agreement with above.  No diagnosis found.  No orders of the defined types were placed in this encounter.   No orders of the defined types were placed in this encounter.

## 2023-03-22 ENCOUNTER — Ambulatory Visit: Payer: Medicare PPO | Admitting: Family Medicine

## 2023-03-22 ENCOUNTER — Other Ambulatory Visit: Payer: Self-pay

## 2023-03-22 ENCOUNTER — Encounter: Payer: Self-pay | Admitting: Family Medicine

## 2023-03-22 VITALS — BP 134/58 | Ht 66.0 in | Wt 145.0 lb

## 2023-03-22 DIAGNOSIS — M1712 Unilateral primary osteoarthritis, left knee: Secondary | ICD-10-CM

## 2023-03-22 DIAGNOSIS — M25562 Pain in left knee: Secondary | ICD-10-CM

## 2023-03-22 NOTE — Assessment & Plan Note (Signed)
Acute left knee pain likely related to flare of OA, but suspect underlying degenerative meniscus tear as well

## 2023-03-22 NOTE — Progress Notes (Signed)
SUBJECTIVE:   CHIEF COMPLAINT / HPI: Left knee pain  Anne [name] is a 82 y.o.  female for evaluation and treatment of left knee pain  Lt knee pain that started after where she needed to climb into backseat of car during trip to [place]. - had difficulty getting in/out of the car - ongoing pain since that time Seen at [facility] 02/25/23 - review of records shows dx with knee sprain   Had f/u visit to [facility] 03/19/23 - still having Lt knee pain - noted pain 60% improved, but still present - unable to return to high intensity exercise - using Voltaren gel prn - using knee brace prn - was given referral for sports med eval.   Today she reports she has no pain.  States that she is having no pain.  She attributes this to being less mobile and in bed for the past week due to a cold.  She has been icing and using naproxen and Voltaren gel which have all mildly helped her pain.  Presenting today as she still has swelling mild pain.  PERTINENT  PMH / PSH: MDD, GAD, OA  OBJECTIVE:   BP (!) 134/58   Ht 5\' 6"  (1.676 m)   Wt 145 lb (65.8 kg)   BMI 23.40 kg/m   General: NAD, well appearing Neuro: A&O Respiratory: normal WOB on RA Extremities: Moving all 4 extremities equally  Left knee Inspection: mild soft tissue swelling present over medial aspect of knee, no obvious deformity, erythema, ecchymoses Palpation: Mildly tender to palpation along the medial joint line, NTTP at quadriceps tendon, patella, patellar tendon, lateral joint line, popliteal fossa, pes anserine bursa ROM: Full range of motion flexion extension knee Special Tests: Positive McMurray's, negative valgus & varus stress test at 0 and 15 degrees, negative anterior posterior drawer, negative Lachman Strength: 5/5 knee flexion extension  IMAGING: Limited US Left Knee: Normal physiologic effusion present in suprapatellar pouch, mild soft tissue swelling  Intact quadriceps tendon distal femur and  proximal patella no cortical irregularity Medial meniscus intact, slight extrusion from medial joint line Mild cortical irregularity proximal medial tibial condyle MCL intact Mild hypoechoic changes surrounding the lateral meniscus Lateral meniscus intact LCL intact Mild cortical irregularity of the lateral tibial condyle and lateral femur  Impression: Mild degenerative changes with osteoarthritis.  No signs of intra-articular injury or ligamentous damage.  ASSESSMENT/PLAN:   Assessment & Plan Acute pain of left knee Secondary to flare of osteoarthritis.  Reassuringly there is no sign of MCL or LCL damage from exam or ultrasound.  There is also no signs of acute damage to the medial or lateral meniscus.    Suspect that this will continue to improve with continued conservative care.  Discussed plan below with patient who is agreeable.  -Continue Voltaren gel 3-4 times daily prn -cont knee sleeve or knee wrap prn -Refrain from overexerting knee and rest for 1 week. -Begin light exercise and return to water aerobics after 1 week if no return of symptoms -Follow-up as needed - consider knee xray, CSI, and/or formal PT if not improving or if worsening Primary osteoarthritis of left knee Acute left knee pain likely related to flare of OA, but suspect underlying degenerative meniscus tear as well -Continue Voltaren gel 3-4 times daily prn -cont knee sleeve or knee wrap prn -Refrain from overexerting knee and rest for 1 week. -Begin light exercise and return to water aerobics after 1 week if no return of symptoms -  Follow-up as needed - consider knee xray, CSI, and/or formal PT if not improving or if worsening  Return if symptoms worsen or fail to improve.  Celine Mans, MD [facility]    ATTENDING ADDENDUM:  Patient seen and examined with resident physician, Celine Mans, MD - PGY-2.  I agree with findings and plan as noted in resident note.  I was present  for ultrasound exam and interpretation of imaging.  Please refer to resident's note for additional details of the visit.   Encounter Diagnoses  Name Primary?   Acute pain of left knee Yes   Primary osteoarthritis of left knee     Orders Placed This Encounter  Procedures   [place] LIMITED JOINT SPACE STRUCTURES LOW LEFT

## 2023-03-24 DIAGNOSIS — H1045 Other chronic allergic conjunctivitis: Secondary | ICD-10-CM | POA: Diagnosis not present

## 2023-03-24 DIAGNOSIS — Z961 Presence of intraocular lens: Secondary | ICD-10-CM | POA: Diagnosis not present

## 2023-03-24 DIAGNOSIS — H18513 Endothelial corneal dystrophy, bilateral: Secondary | ICD-10-CM | POA: Diagnosis not present

## 2023-03-24 DIAGNOSIS — H0102B Squamous blepharitis left eye, upper and lower eyelids: Secondary | ICD-10-CM | POA: Diagnosis not present

## 2023-03-24 DIAGNOSIS — H04123 Dry eye syndrome of bilateral lacrimal glands: Secondary | ICD-10-CM | POA: Diagnosis not present

## 2023-03-24 DIAGNOSIS — H353131 Nonexudative age-related macular degeneration, bilateral, early dry stage: Secondary | ICD-10-CM | POA: Diagnosis not present

## 2023-03-24 DIAGNOSIS — H40013 Open angle with borderline findings, low risk, bilateral: Secondary | ICD-10-CM | POA: Diagnosis not present

## 2023-03-24 DIAGNOSIS — H0102A Squamous blepharitis right eye, upper and lower eyelids: Secondary | ICD-10-CM | POA: Diagnosis not present

## 2023-05-03 ENCOUNTER — Other Ambulatory Visit (HOSPITAL_COMMUNITY): Payer: Self-pay

## 2023-05-20 DIAGNOSIS — R053 Chronic cough: Secondary | ICD-10-CM | POA: Diagnosis not present

## 2023-05-20 DIAGNOSIS — J069 Acute upper respiratory infection, unspecified: Secondary | ICD-10-CM | POA: Diagnosis not present

## 2023-05-20 DIAGNOSIS — J00 Acute nasopharyngitis [common cold]: Secondary | ICD-10-CM | POA: Diagnosis not present

## 2023-05-20 DIAGNOSIS — Z6824 Body mass index (BMI) 24.0-24.9, adult: Secondary | ICD-10-CM | POA: Diagnosis not present

## 2023-05-20 DIAGNOSIS — R059 Cough, unspecified: Secondary | ICD-10-CM | POA: Diagnosis not present

## 2023-09-12 DIAGNOSIS — R3915 Urgency of urination: Secondary | ICD-10-CM | POA: Diagnosis not present

## 2023-09-12 DIAGNOSIS — R350 Frequency of micturition: Secondary | ICD-10-CM | POA: Diagnosis not present

## 2023-09-12 DIAGNOSIS — N3020 Other chronic cystitis without hematuria: Secondary | ICD-10-CM | POA: Diagnosis not present

## 2023-09-27 DIAGNOSIS — M85851 Other specified disorders of bone density and structure, right thigh: Secondary | ICD-10-CM | POA: Diagnosis not present

## 2023-10-05 DIAGNOSIS — Z6824 Body mass index (BMI) 24.0-24.9, adult: Secondary | ICD-10-CM | POA: Diagnosis not present

## 2023-10-05 DIAGNOSIS — S81812A Laceration without foreign body, left lower leg, initial encounter: Secondary | ICD-10-CM | POA: Diagnosis not present

## 2023-10-18 DIAGNOSIS — N390 Urinary tract infection, site not specified: Secondary | ICD-10-CM | POA: Diagnosis not present

## 2023-10-18 DIAGNOSIS — M85851 Other specified disorders of bone density and structure, right thigh: Secondary | ICD-10-CM | POA: Diagnosis not present

## 2023-10-18 DIAGNOSIS — N3941 Urge incontinence: Secondary | ICD-10-CM | POA: Diagnosis not present

## 2023-10-18 DIAGNOSIS — F331 Major depressive disorder, recurrent, moderate: Secondary | ICD-10-CM | POA: Diagnosis not present

## 2023-10-18 DIAGNOSIS — Z0000 Encounter for general adult medical examination without abnormal findings: Secondary | ICD-10-CM | POA: Diagnosis not present

## 2023-10-18 DIAGNOSIS — Z13220 Encounter for screening for lipoid disorders: Secondary | ICD-10-CM | POA: Diagnosis not present

## 2023-10-18 DIAGNOSIS — Z6823 Body mass index (BMI) 23.0-23.9, adult: Secondary | ICD-10-CM | POA: Diagnosis not present

## 2024-01-06 DIAGNOSIS — J029 Acute pharyngitis, unspecified: Secondary | ICD-10-CM | POA: Diagnosis not present

## 2024-01-06 DIAGNOSIS — R5383 Other fatigue: Secondary | ICD-10-CM | POA: Diagnosis not present

## 2024-01-06 DIAGNOSIS — R051 Acute cough: Secondary | ICD-10-CM | POA: Diagnosis not present

## 2024-01-06 DIAGNOSIS — Z03818 Encounter for observation for suspected exposure to other biological agents ruled out: Secondary | ICD-10-CM | POA: Diagnosis not present

## 2024-01-13 DIAGNOSIS — R059 Cough, unspecified: Secondary | ICD-10-CM | POA: Diagnosis not present

## 2024-01-13 DIAGNOSIS — J019 Acute sinusitis, unspecified: Secondary | ICD-10-CM | POA: Diagnosis not present

## 2024-03-29 DIAGNOSIS — F325 Major depressive disorder, single episode, in full remission: Secondary | ICD-10-CM | POA: Diagnosis not present

## 2024-03-29 DIAGNOSIS — Z87440 Personal history of urinary (tract) infections: Secondary | ICD-10-CM | POA: Diagnosis not present

## 2024-03-29 DIAGNOSIS — Z8249 Family history of ischemic heart disease and other diseases of the circulatory system: Secondary | ICD-10-CM | POA: Diagnosis not present

## 2024-03-29 DIAGNOSIS — Z87892 Personal history of anaphylaxis: Secondary | ICD-10-CM | POA: Diagnosis not present

## 2024-03-29 DIAGNOSIS — N3941 Urge incontinence: Secondary | ICD-10-CM | POA: Diagnosis not present

## 2024-03-29 DIAGNOSIS — N1831 Chronic kidney disease, stage 3a: Secondary | ICD-10-CM | POA: Diagnosis not present

## 2024-03-29 DIAGNOSIS — Z882 Allergy status to sulfonamides status: Secondary | ICD-10-CM | POA: Diagnosis not present

## 2024-03-29 DIAGNOSIS — F411 Generalized anxiety disorder: Secondary | ICD-10-CM | POA: Diagnosis not present

## 2024-03-29 DIAGNOSIS — Z809 Family history of malignant neoplasm, unspecified: Secondary | ICD-10-CM | POA: Diagnosis not present

## 2024-04-09 DIAGNOSIS — H40013 Open angle with borderline findings, low risk, bilateral: Secondary | ICD-10-CM | POA: Diagnosis not present

## 2024-04-09 DIAGNOSIS — H1045 Other chronic allergic conjunctivitis: Secondary | ICD-10-CM | POA: Diagnosis not present

## 2024-04-09 DIAGNOSIS — H0102B Squamous blepharitis left eye, upper and lower eyelids: Secondary | ICD-10-CM | POA: Diagnosis not present

## 2024-04-09 DIAGNOSIS — H04123 Dry eye syndrome of bilateral lacrimal glands: Secondary | ICD-10-CM | POA: Diagnosis not present

## 2024-04-09 DIAGNOSIS — H18513 Endothelial corneal dystrophy, bilateral: Secondary | ICD-10-CM | POA: Diagnosis not present

## 2024-04-09 DIAGNOSIS — Z961 Presence of intraocular lens: Secondary | ICD-10-CM | POA: Diagnosis not present

## 2024-04-09 DIAGNOSIS — H353131 Nonexudative age-related macular degeneration, bilateral, early dry stage: Secondary | ICD-10-CM | POA: Diagnosis not present

## 2024-04-09 DIAGNOSIS — H0102A Squamous blepharitis right eye, upper and lower eyelids: Secondary | ICD-10-CM | POA: Diagnosis not present
